# Patient Record
Sex: Male | Born: 1985 | Race: White | Hispanic: No | Smoking: Never smoker
Health system: Southern US, Community
[De-identification: ages and names within clinical notes are randomized; demographics above are authoritative.]

## PROBLEM LIST (undated history)

## (undated) DIAGNOSIS — G4733 Obstructive sleep apnea (adult) (pediatric): Secondary | ICD-10-CM

## (undated) DIAGNOSIS — I10 Essential (primary) hypertension: Secondary | ICD-10-CM

## (undated) DIAGNOSIS — F419 Anxiety disorder, unspecified: Secondary | ICD-10-CM

## (undated) DIAGNOSIS — E119 Type 2 diabetes mellitus without complications: Secondary | ICD-10-CM

## (undated) DIAGNOSIS — G43909 Migraine, unspecified, not intractable, without status migrainosus: Secondary | ICD-10-CM

## (undated) DIAGNOSIS — F4310 Post-traumatic stress disorder, unspecified: Secondary | ICD-10-CM

## (undated) HISTORY — DX: Obstructive sleep apnea (adult) (pediatric): G47.33

## (undated) HISTORY — DX: Post-traumatic stress disorder, unspecified: F43.10

## (undated) HISTORY — DX: Migraine, unspecified, not intractable, without status migrainosus: G43.909

---
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
## 2003-08-23 ENCOUNTER — Encounter: Payer: Self-pay | Admitting: Emergency Medicine

## 2003-08-23 ENCOUNTER — Emergency Department (HOSPITAL_COMMUNITY): Admission: EM | Admit: 2003-08-23 | Discharge: 2003-08-23 | Payer: Self-pay | Admitting: Emergency Medicine

## 2005-08-21 ENCOUNTER — Observation Stay (HOSPITAL_COMMUNITY): Admission: AD | Admit: 2005-08-21 | Discharge: 2005-08-22 | Payer: Self-pay | Admitting: Family Medicine

## 2006-03-18 ENCOUNTER — Emergency Department (HOSPITAL_COMMUNITY): Admission: EM | Admit: 2006-03-18 | Discharge: 2006-03-18 | Payer: Self-pay | Admitting: Emergency Medicine

## 2006-03-25 ENCOUNTER — Ambulatory Visit: Payer: Self-pay | Admitting: Orthopedic Surgery

## 2006-09-01 ENCOUNTER — Emergency Department (HOSPITAL_COMMUNITY): Admission: EM | Admit: 2006-09-01 | Discharge: 2006-09-01 | Payer: Self-pay | Admitting: Emergency Medicine

## 2009-10-28 ENCOUNTER — Emergency Department (HOSPITAL_COMMUNITY): Admission: EM | Admit: 2009-10-28 | Discharge: 2009-10-29 | Payer: Self-pay | Admitting: Emergency Medicine

## 2011-05-15 NOTE — H&P (Signed)
Steven Adkins, Steven Adkins               ACCOUNT NO.:  [id number]   MEDICAL RECORD NO.:  [id number]          PATIENT TYPE:  INP   LOCATION:  A308                          FACILITY:  [facility]   PHYSICIAN:  Scott A. Gerda Diss, MD    DATE OF BIRTH:  14-Jan-1986   DATE OF ADMISSION:  08/21/2005  DATE OF DISCHARGE:  LH                                HISTORY & PHYSICAL   OBSERVATION:   CHIEF COMPLAINT:  Severe pharyngitis.   HISTORY OF PRESENT ILLNESS:  This is a 25 year old white male who has had  about a 4-day history of severe throat pain, discomfort, fever, chills,  aches, not feeling good, headache, in addition to this has vomited multiple  times a day, urinated once early this morning (it was a dark yellow color),  feels nauseated and faint like when he stands up.   PAST MEDICAL HISTORY:  Normal health history, at one time had ADD, has  recently over the past few years had very good health with minimal health  problems and minimal need to come to the doctor.  Does not smoke or drink.  Does not use any drugs.   FAMILY HISTORY:  Benign family history.   REVIEW OF SYSTEMS:  See per above.  Positive for headache, sore throat,  slight cough, gagging and vomiting.  No diarrhea, no dysuria.   PHYSICAL EXAMINATION:  HEAD AND NECK:  TMs NL.  Throat severe erythema with  exudate noted.  Neck positive anterior nodes, worse on the left than the  right.  CHEST:  Lungs are clear.  Heart is regular.  ABDOMEN:  Abdomen is soft.  EXTREMITIES:  No edema.  SKIN:  Warm and dry.   LABORATORIES:  MET-7 overall looks good.   ASSESSMENT AND PLAN:  Severe pharyngitis with gastroenteritis - I feel the  vomiting is secondary to how bad he feels.  Because of the multiple episodes  of vomiting alone with near syncope with standing up and the severe sore  throat it is felt the patient needed to be admitted in the hospital.  His  Rapid Strep was negative.  We went ahead and did blood work.  The white  count slightly  elevated with a right shift and a positive Mono Screen.  Patient will be  treated conservatively, IV fluids overnight, pain medicine as necessary and  most likely will be able to go home in the morning on Phenergan for nausea  and Vicodin for pain, cautioned drowsiness.  He is to follow up in the  office on Thursday, August 31 and stay out of work until then with low  activity.      Scott A. Gerda Diss, MD  Electronically Signed     SAL/MEDQ  D:  08/21/2005  T:  08/21/2005  Job:  [id number]

## 2012-05-10 ENCOUNTER — Encounter: Payer: Self-pay | Admitting: Pulmonary Disease

## 2012-05-10 ENCOUNTER — Ambulatory Visit (INDEPENDENT_AMBULATORY_CARE_PROVIDER_SITE_OTHER): Payer: PRIVATE HEALTH INSURANCE | Admitting: Pulmonary Disease

## 2012-05-10 VITALS — BP 114/76 | HR 73 | Temp 97.9°F | Ht 73.0 in | Wt 295.6 lb

## 2012-05-10 DIAGNOSIS — G4710 Hypersomnia, unspecified: Secondary | ICD-10-CM

## 2012-05-10 DIAGNOSIS — G4733 Obstructive sleep apnea (adult) (pediatric): Secondary | ICD-10-CM

## 2012-05-10 DIAGNOSIS — F5112 Insufficient sleep syndrome: Secondary | ICD-10-CM

## 2012-05-10 NOTE — Progress Notes (Deleted)
  Subjective:    Patient ID: Steven Adkins, male    DOB: [birth year], 26 y.o.   MRN: [id number]  HPI    Review of Systems  Constitutional: Positive for unexpected weight change. Negative for fever and appetite change.  HENT: Positive for trouble swallowing. Negative for ear pain, congestion, sore throat, sneezing and dental problem.   Respiratory: Negative for cough and shortness of breath.   Cardiovascular: Negative for chest pain, palpitations and leg swelling.  Gastrointestinal: Negative for abdominal pain.  Musculoskeletal: Negative for joint swelling.  Skin: Negative for rash.  Neurological: Negative for headaches.  Psychiatric/Behavioral: Negative for dysphoric mood. The patient is not nervous/anxious.        Objective:   Physical Exam        Assessment & Plan:

## 2012-05-10 NOTE — Progress Notes (Signed)
Chief Complaint  Patient presents with  . sleep consult    self referral. Pt c/o toss and turning, uncontrollable body jerking at night, difficulty breathing at night, snores    History of Present Illness: Steven Adkins is a 26 y.o. male for evaluation of sleep problems.  He has noticed more trouble with his sleep over the past 3 years.  This has been associated with about 150 lbs weight gain.    His wife states that he snores and will stop breathing at night.  Before he wakes up he will get jerking movements, and choking sensation.  These seem to happen more frequently.  He works as a IT sales professional.  He works both days and nights in rotating schedule.  He gets about 5 hours sleep per day.  He can fall asleep easily when sitting quiet.  He is not using anything to help him sleep or stay awake.  His Epworth score is 14 out of 24.  The patient denies sleep walking, sleep talking, bruxism, or nightmares.  There is no history of restless legs.  The patient denies sleep hallucinations, sleep paralysis, or cataplexy.   No past medical history on file.  No past surgical history on file.  No current outpatient prescriptions on file prior to visit.    No Known Allergies  family history includes Breast cancer in his paternal grandmother and Lung cancer in his paternal grandmother.   reports that he has never smoked. He does not have any smokeless tobacco history on file. He reports that he does not drink alcohol or use illicit drugs.  Review of Systems  Constitutional: Positive for unexpected weight change. Negative for fever and appetite change.  HENT: Positive for trouble swallowing. Negative for ear pain, congestion, sore throat, sneezing and dental problem.   Respiratory: Negative for cough and shortness of breath.   Cardiovascular: Negative for chest pain, palpitations and leg swelling.  Gastrointestinal: Negative for abdominal pain.  Musculoskeletal: Negative for joint  swelling.  Skin: Negative for rash.  Neurological: Negative for headaches.  Psychiatric/Behavioral: Negative for dysphoric mood. The patient is not nervous/anxious.     Physical Exam: BP 114/76  Pulse 73  Temp(Src) 97.9 F (36.6 C) (Oral)  Ht 6\' 1"  (1.854 m)  Wt 295 lb 9.6 oz (134.083 kg)  BMI 39.00 kg/m2  SpO2 98%  Body mass index is 39.00 kg/(m^2).   General - Obese HEENT - PERRLA, EOMI, no sinus tenderness, MP 4, no LAN Cardiac - s1s2 regular, no murmur Chest -  No wheeze/rales/dullness Abdomen - obese, soft, nontender Extremities - no e/c/c Neurologic - normal strength, CN intact Skin - no rashes Psychiatric - normal mood, behavior  Assessment/Plan:  No outpatient encounter prescriptions on file as of 05/10/2012.    Kenora Spayd Pager:  [phone] 05/10/2012, 9:36 AM

## 2012-05-10 NOTE — Patient Instructions (Signed)
Will schedule Sleep study Will call to schedule follow up after sleep study reviewed

## 2012-05-10 NOTE — Assessment & Plan Note (Signed)
He has snoring, witnessed apnea, sleep disruption, and daytime sleepiness.  I am concerned he could have sleep apnea.  I have explained how sleep apnea can affect the patient's health.  Driving precautions and importance of weight loss were discussed.  Treatment options for sleep apnea were reviewed.  To further evaluate will arrange for in lab sleep study.

## 2012-05-10 NOTE — Assessment & Plan Note (Signed)
Explained the importance of maintaining a regular sleep wake schedule, and trying to consolidate his sleep to one time period.

## 2012-05-27 ENCOUNTER — Emergency Department (HOSPITAL_COMMUNITY): Payer: PRIVATE HEALTH INSURANCE

## 2012-05-27 ENCOUNTER — Encounter (HOSPITAL_COMMUNITY): Payer: Self-pay | Admitting: *Deleted

## 2012-05-27 ENCOUNTER — Emergency Department (HOSPITAL_COMMUNITY)
Admission: EM | Admit: 2012-05-27 | Discharge: 2012-05-28 | Disposition: A | Discharge status: Home / Self Care | Payer: PRIVATE HEALTH INSURANCE | Attending: Emergency Medicine | Admitting: Emergency Medicine

## 2012-05-27 DIAGNOSIS — R000 Tachycardia, unspecified: Secondary | ICD-10-CM | POA: Insufficient documentation

## 2012-05-27 DIAGNOSIS — R0602 Shortness of breath: Secondary | ICD-10-CM | POA: Insufficient documentation

## 2012-05-27 DIAGNOSIS — R110 Nausea: Secondary | ICD-10-CM | POA: Insufficient documentation

## 2012-05-27 DIAGNOSIS — R51 Headache: Secondary | ICD-10-CM | POA: Insufficient documentation

## 2012-05-27 DIAGNOSIS — R079 Chest pain, unspecified: Secondary | ICD-10-CM

## 2012-05-27 HISTORY — DX: Essential (primary) hypertension: I10

## 2012-05-27 LAB — BASIC METABOLIC PANEL
Chloride: 105 mEq/L (ref 96–112)
GFR calc Af Amer: >90 mL/min (ref >90)
GFR calc non Af Amer: >90 mL/min (ref >90)
Glucose, Bld: 106 mg/dL — ABNORMAL HIGH (ref 70–99)
Potassium: 3.5 mEq/L (ref 3.5–5.1)
Sodium: 136 mEq/L (ref 135–145)

## 2012-05-27 LAB — CBC
MCH: 28.2 pg (ref 26.0–34.0)
Platelets: 206 10*3/uL (ref 150–400)
RBC: 5.32 MIL/uL (ref 4.22–5.81)

## 2012-05-27 LAB — DIFFERENTIAL
Basophils Relative: 1 % (ref 0–1)
Eosinophils Absolute: 0.1 10*3/uL (ref 0.0–0.7)
Lymphs Abs: 2.2 10*3/uL (ref 0.7–4.0)
Neutro Abs: 9 10*3/uL — ABNORMAL HIGH (ref 1.7–7.7)
Neutrophils Relative %: 75 % (ref 43–77)

## 2012-05-27 LAB — TROPONIN I
Troponin I: <0.3 ng/mL (ref <0.30)
Troponin I: <0.3 ng/mL (ref <0.30)

## 2012-05-27 MED ORDER — SODIUM CHLORIDE 0.9 % IV BOLUS (SEPSIS)
250.0000 mL | Freq: Once | INTRAVENOUS | Status: AC
  Administered 2012-05-27: 500 mL via INTRAVENOUS

## 2012-05-27 MED ORDER — HYDROMORPHONE HCL PF 1 MG/ML IJ SOLN
1.0000 mg | Freq: Once | INTRAMUSCULAR | Status: AC
  Administered 2012-05-27: 1 mg via INTRAVENOUS
  Filled 2012-05-27: qty 1

## 2012-05-27 MED ORDER — HYDROMORPHONE HCL PF 1 MG/ML IJ SOLN
1.0000 mg | Freq: Once | INTRAMUSCULAR | Status: AC
  Administered 2012-05-28: 1 mg via INTRAVENOUS
  Filled 2012-05-27: qty 1

## 2012-05-27 MED ORDER — ONDANSETRON HCL 4 MG/2ML IJ SOLN
4.0000 mg | Freq: Once | INTRAMUSCULAR | Status: AC
  Administered 2012-05-27: 4 mg via INTRAVENOUS
  Filled 2012-05-27: qty 2

## 2012-05-27 MED ORDER — SODIUM CHLORIDE 0.9 % IV SOLN: INTRAVENOUS | Status: DC

## 2012-05-27 NOTE — ED Notes (Signed)
Chest pain, states it started within the hour, c/o nausea

## 2012-05-27 NOTE — ED Provider Notes (Signed)
History   This chart was scribed for Shelda Jakes, MD by Charolett Bumpers . The patient was seen in room [facility].    CSN: [id number]  Arrival date & time 05/27/12  1858   First MD Initiated Contact with Patient 05/27/12 1904      Chief Complaint  Patient presents with  . Chest Pain    (Consider location/radiation/quality/duration/timing/severity/associated sxs/prior treatment) HPI Steven Adkins is a 26 y.o. male who presents to the Emergency Department complaining of constant, moderate to severe chest pain with associated diaphoresis, mild SOB, nausea, and headache since 6:00pm today. Patient states that his chest pain is mid-sternal, radiates to his left arm and is rated a 7/10. Patient states that he is currently having the chest pain. Patient states that he was given 2 Nitroglycerin and an aspirin by EMS, with some improvement of symptoms. Patient denies any v/d. Patient states that he felt dizziness prior to work today, but no chest pain. Patient states that the headache started after he was given nitroglycerin. Patient denies any prior hx of similar symptoms. Patient reports a h/o HTN. BP here in ED is 188/150. Per nurse's report, the patient stated that he started a diet pill one week ago.    Past Medical History  Diagnosis Date  . Hypertension     History reviewed. No pertinent past surgical history.  Family History  Problem Relation Age of Onset  . Breast cancer Paternal Grandmother   . Lung cancer Paternal Grandmother     History  Substance Use Topics  . Smoking status: Never Smoker   . Smokeless tobacco: Not on file  . Alcohol Use: No      Review of Systems  Constitutional: Positive for diaphoresis.  HENT: Negative for congestion, sore throat and neck pain.   Respiratory: Positive for shortness of breath.   Cardiovascular: Positive for chest pain.  Gastrointestinal: Positive for nausea. Negative for vomiting and abdominal pain.    Genitourinary: Negative for dysuria.  Musculoskeletal: Negative for back pain.  Neurological: Positive for headaches.  Hematological: Does not bruise/bleed easily.  All other systems reviewed and are negative.    Allergies  Review of patient's allergies indicates no known allergies.  Home Medications   Current Outpatient Rx  Name Route Sig Dispense Refill  . UNKNOWN TO PATIENT Oral Take 1 packet by mouth daily. [employer] PRO-PERFORMANCE for MEN      BP 142/84  Pulse 76  Temp(Src) 98.4 F (36.9 C) (Oral)  Resp 20  SpO2 92%  Physical Exam  Nursing note and vitals reviewed. Constitutional: He is oriented to person, place, and time. He appears well-developed and well-nourished. No distress.  HENT:  Head: Normocephalic and atraumatic.  Eyes: EOM are normal. Pupils are equal, round, and reactive to light.  Neck: Neck supple. No tracheal deviation present.  Cardiovascular: Regular rhythm and normal heart sounds.  Tachycardia present.   No murmur heard. Pulmonary/Chest: Effort normal and breath sounds normal. No respiratory distress.  Abdominal: Soft. Bowel sounds are normal. He exhibits no distension. There is no tenderness.  Musculoskeletal: Normal range of motion. He exhibits no edema.  Neurological: He is alert and oriented to person, place, and time. No sensory deficit.  Skin: Skin is warm and dry.  Psychiatric: He has a normal mood and affect. His behavior is normal.    ED Course  Procedures (including critical care time)  DIAGNOSTIC STUDIES: Oxygen Saturation is 97% on room air, normal by my interpretation.    COORDINATION  OF CARE:  1910: Discussed planned course of treatment with the patient, who is agreeable at this time.  1930: Medication Orders: Sodium Chloride 0.9% bolus 250 mL-once; Hydromorphone (Dilaudid) injection 1 mg-once; Ondansetron (Zofran) injection 4 mg-once.  Results for orders placed during the hospital encounter of 05/27/12  CBC      Component  Value Range   WBC 12.1 (*) 4.0 - 10.5 (K/uL)   RBC 5.32  4.22 - 5.81 (MIL/uL)   Hemoglobin 15.0  13.0 - 17.0 (g/dL)   HCT 16.1  09.6 - 04.5 (%)   MCV 81.4  78.0 - 100.0 (fL)   MCH 28.2  26.0 - 34.0 (pg)   MCHC 34.6  30.0 - 36.0 (g/dL)   RDW 40.9  81.1 - 91.4 (%)   Platelets 206  150 - 400 (K/uL)  DIFFERENTIAL      Component Value Range   Neutrophils Relative 75  43 - 77 (%)   Neutro Abs 9.0 (*) 1.7 - 7.7 (K/uL)   Lymphocytes Relative 18  12 - 46 (%)   Lymphs Abs 2.2  0.7 - 4.0 (K/uL)   Monocytes Relative 6  3 - 12 (%)   Monocytes Absolute 0.7  0.1 - 1.0 (K/uL)   Eosinophils Relative 1  0 - 5 (%)   Eosinophils Absolute 0.1  0.0 - 0.7 (K/uL)   Basophils Relative 1  0 - 1 (%)   Basophils Absolute 0.1  0.0 - 0.1 (K/uL)  BASIC METABOLIC PANEL      Component Value Range   Sodium 136  135 - 145 (mEq/L)   Potassium 3.5  3.5 - 5.1 (mEq/L)   Chloride 105  96 - 112 (mEq/L)   CO2 23  19 - 32 (mEq/L)   Glucose, Bld 106 (*) 70 - 99 (mg/dL)   BUN 10  6 - 23 (mg/dL)   Creatinine, Ser 7.82  0.50 - 1.35 (mg/dL)   Calcium 9.6  8.4 - 95.6 (mg/dL)   GFR calc non Af Amer >90  >90 (mL/min)   GFR calc Af Amer >90  >90 (mL/min)  D-DIMER, QUANTITATIVE      Component Value Range   D-Dimer, Quant <0.22  0.00 - 0.48 (ug/mL-FEU)  TROPONIN I      Component Value Range   Troponin I <0.30  <0.30 (ng/mL)  TROPONIN I      Component Value Range   Troponin I <0.30  <0.30 (ng/mL)      Date: 05/27/2012  Rate: 103  Rhythm: sinus tachycardia  QRS Axis: normal  Intervals: normal  ST/T Wave abnormalities: normal  Conduction Disutrbances:none  Narrative Interpretation:   Old EKG Reviewed: none available   1. Chest pain       MDM  Patient with chest pain radiating into the left arm and substernal. Onset at about 6 in the evening. Troponin x2 is been negative six-hour troponin will be admitted night d-dimer is negative patient the with persistent pain we'll probably proceed with CT angiogram to rule  out PE. Patient originally given aspirin and nitroglycerin x2 by EMS with slight improvement in ED setting given the lauded 3 times without complete resolution of the chest pain. Doubt this is cardiac in nature pulmonary malaise and is a possibility no clinical evidence for no swelling in the legs. Patient is large and does use tobacco. Order CT angios 6 clock troponin has been ordered for midnight. If negative and the six-hour troponin is negative patient we discharged home with pain control.  I personally performed the services described in this documentation, which was scribed in my presence. The recorded information has been reviewed and considered.         Shelda Jakes, MD 05/27/12 [id number]

## 2012-05-27 NOTE — ED Notes (Signed)
Denies any relief from pain meds

## 2012-05-27 NOTE — ED Notes (Signed)
Started on a diet pill one week ago

## 2012-05-28 MED ORDER — IOHEXOL 350 MG/ML SOLN
125.0000 mL | Freq: Once | INTRAVENOUS | Status: AC | PRN
  Administered 2012-05-28: 125 mL via INTRAVENOUS

## 2012-05-31 ENCOUNTER — Ambulatory Visit (HOSPITAL_BASED_OUTPATIENT_CLINIC_OR_DEPARTMENT_OTHER): Payer: PRIVATE HEALTH INSURANCE | Attending: Pulmonary Disease | Admitting: Radiology

## 2012-05-31 VITALS — Ht 73.0 in | Wt 295.0 lb

## 2012-05-31 DIAGNOSIS — Z9989 Dependence on other enabling machines and devices: Secondary | ICD-10-CM

## 2012-05-31 DIAGNOSIS — G4733 Obstructive sleep apnea (adult) (pediatric): Secondary | ICD-10-CM | POA: Insufficient documentation

## 2012-06-08 ENCOUNTER — Ambulatory Visit (INDEPENDENT_AMBULATORY_CARE_PROVIDER_SITE_OTHER): Payer: PRIVATE HEALTH INSURANCE | Admitting: Family Medicine

## 2012-06-08 ENCOUNTER — Encounter: Payer: Self-pay | Admitting: Family Medicine

## 2012-06-08 VITALS — BP 124/88 | HR 68 | Resp 16 | Ht 73.0 in | Wt 289.0 lb

## 2012-06-08 DIAGNOSIS — R7309 Other abnormal glucose: Secondary | ICD-10-CM

## 2012-06-08 DIAGNOSIS — R5381 Other malaise: Secondary | ICD-10-CM

## 2012-06-08 DIAGNOSIS — R5383 Other fatigue: Secondary | ICD-10-CM

## 2012-06-08 DIAGNOSIS — R6882 Decreased libido: Secondary | ICD-10-CM

## 2012-06-08 DIAGNOSIS — R739 Hyperglycemia, unspecified: Secondary | ICD-10-CM

## 2012-06-08 DIAGNOSIS — E669 Obesity, unspecified: Secondary | ICD-10-CM

## 2012-06-08 DIAGNOSIS — G4733 Obstructive sleep apnea (adult) (pediatric): Secondary | ICD-10-CM

## 2012-06-08 LAB — POCT URINALYSIS DIPSTICK
Bilirubin, UA: NEGATIVE
Blood, UA: NEGATIVE
Ketones, UA: NEGATIVE
Spec Grav, UA: >=1.03
pH, UA: 5.5

## 2012-06-08 LAB — LIPID PANEL
Total CHOL/HDL Ratio: 7 Ratio
VLDL: 31 mg/dL (ref 0–40)

## 2012-06-08 LAB — TSH: TSH: 1.645 u[IU]/mL (ref 0.350–4.500)

## 2012-06-08 NOTE — Patient Instructions (Addendum)
Get the labs drawn, we will call with results Call [facility] for your sleep study  Take multivitamin once a day  F/U 1 year or as needed

## 2012-06-08 NOTE — Progress Notes (Signed)
  Subjective:    Patient ID: Steven Adkins, male    DOB: [birth year], 26 y.o.   MRN: [id number]  HPI Pt here to establish care, no previous PCP, no meds He was seen by [facility] for sleep study completed last week secondary to snoring and fatigue, he has gained 60lbs in the past year. 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  He complains of being execessived fatigued, he does work out, he was seen in ED for Chest pain after using a [employer] supplement for weight loss "Ripped", negative work-up. He has also had difficulty maintaining and keep an erection for many years now.     Review of Systems   GEN- + fatigue, fever, weight loss,weakness, recent illness HEENT- denies eye drainage, change in vision, nasal discharge, CVS- denies chest pain, palpitations RESP- denies SOB, cough, wheeze ABD- denies N/V, change in stools, abd pain GU- denies dysuria, hematuria, dribbling, incontinence MSK- denies joint pain, muscle aches, injury Neuro- denies headache, dizziness, syncope, seizure activity      Objective:   Physical Exam GEN- NAD, alert and oriented x3, obese  HEENT- PERRL, EOMI, non injected sclera, pink conjunctiva, MMM, oropharynx clear Neck- Supple, no thryomegaly CVS- RRR, no murmur RESP-CTAB ABD-NABS,soft, NT,ND EXT- No edema Pulses- Radial, DP- 2+ Psych-normal affect and mood       Assessment & Plan:

## 2012-06-09 ENCOUNTER — Encounter: Payer: Self-pay | Admitting: Family Medicine

## 2012-06-09 LAB — TESTOSTERONE, FREE, TOTAL, SHBG: Testosterone: 228.16 ng/dL — ABNORMAL LOW (ref 300–890)

## 2012-06-09 NOTE — Assessment & Plan Note (Signed)
UA negative, labs per above, tesosterone also checked and appears to be low for pt age, will refer him to urology

## 2012-06-09 NOTE — Assessment & Plan Note (Signed)
Check A1C 

## 2012-06-09 NOTE — Assessment & Plan Note (Signed)
Will check baseline labs, A1c, TSH. Encouraged exercise, no use of supplments, OSA also contributing

## 2012-06-09 NOTE — Assessment & Plan Note (Signed)
He will f/u with Pulmonary for results, I do not see them today

## 2012-06-14 ENCOUNTER — Telehealth: Payer: Self-pay | Admitting: Pulmonary Disease

## 2012-06-14 DIAGNOSIS — G4733 Obstructive sleep apnea (adult) (pediatric): Secondary | ICD-10-CM

## 2012-06-14 NOTE — Procedures (Signed)
NAME:  Steven Adkins, BIERNAT NO.:  [id number]  MEDICAL RECORD NO.:  [id number]          PATIENT TYPE:  OUT  LOCATION:  SLEEP CENTER                 FACILITY:  [facility]  PHYSICIAN:  Coralyn Helling, MD        DATE OF BIRTH:  1986/05/11  DATE OF STUDY:  05/31/2012                           NOCTURNAL POLYSOMNOGRAM  REFERRING PHYSICIAN:  Coralyn Helling, MD  REFERRING PHYSICIAN:  Coralyn Helling, MD  INDICATION:  Mr. Longman is a 26 year old male who reports symptoms of snoring, sleep disruption, witnessed apnea, and daytime sleepiness.  He is referred to sleep lab for evaluation of hypersomnia with obstructive sleep apnea.  Height is 6 feet 1 inches, weight is 295 pounds.  BMI is 39.  Neck size is 18.5 inches.  Medications none.  Epworth score is 9.  SLEEP ARCHITECTURE:  The patient followed a split night study protocol. During the diagnostic portion of study, total recording time was 211 minutes.  Total sleep time was 137 minutes.  Sleep efficiency was 65%. Sleep latency was 21 minutes.  REM latency was 144 minutes.  The patient was observed in all stages of sleep and slept predominantly in the nonsupine position.  During the titration portion of study, total recording time was 165 minutes.  Total sleep time was 64 minutes.  Sleep efficiency was 39%. Sleep latency was 22 minutes.  This portion of the study was notable for the lack of REM sleep and he slept predominantly in the nonsupine position.  RESPIRATORY DATA:  The average respiratory rate was 16.  Moderate snoring was noted by the technician.  The overall apnea/hypopnea index was 23.6.  The events were exclusively obstructive in nature.  During the titration portion of the study, the patient was started on CPAP of 5 and increased to 6 cm of water.  With CPAP at 6 cm water, his apnea/hypopnea index was 0.  CARDIAC DATA:  The average heart rate was 70 and the rhythm strip showed sinus rhythm with occasional PACs and  PVCs.  OXYGEN DATA:  The baseline oxygenation was 97%.  The oxygen saturation nadir was 86%.  The study was conducted without the use of supplemental oxygen.  MOVEMENT/PARASOMNIA:  The periodic limb movement index was 0 and the patient had no restroom trips.  IMPRESSION:  This study shows evidence for moderate obstructive sleep apnea with an overall apnea/hypopnea index of 23.6 and oxygen saturation nadir of 86%.  He had good control of his respiratory events with CPAP at 6 cm of water.  Of note is that he was not observed in REM sleep while wearing CPAP.  In addition to diet, exercise, and weight reduction, I would recommend that the patient be started on CPAP 6 cm water and monitored for his clinical response.     Coralyn Helling, MD Diplomat, American Board of Sleep Medicine    VS/MEDQ  D:  06/14/2012 12:59:59  T:  06/14/2012 13:50:38  Job:  [id number]

## 2012-06-14 NOTE — Telephone Encounter (Signed)
PSG 05/31/12 >> AHI 23.6, SpO2 low 86%, PLMI 0.  CPAP 6 cm H2O >> AHI 0, + S, no R.  Results discussed with patient over phone.  Explained that best option is CPAP set up.  He would like to discuss with his wife before making a decision about this.  He will call when he makes a decision.  Will have my nurse schedule ROV in 2 to 3 months.

## 2012-06-16 NOTE — Telephone Encounter (Signed)
I spoke with pt and he states he is going to wait before he makes an apt. He will call when he is ready

## 2013-11-10 ENCOUNTER — Encounter: Payer: Self-pay | Admitting: Family Medicine

## 2013-11-10 ENCOUNTER — Ambulatory Visit (INDEPENDENT_AMBULATORY_CARE_PROVIDER_SITE_OTHER): Payer: PRIVATE HEALTH INSURANCE | Admitting: Family Medicine

## 2013-11-10 VITALS — BP 110/70 | HR 98 | Temp 97.8°F | Resp 18 | Ht 70.5 in | Wt 278.0 lb

## 2013-11-10 DIAGNOSIS — F411 Generalized anxiety disorder: Secondary | ICD-10-CM

## 2013-11-10 DIAGNOSIS — G4733 Obstructive sleep apnea (adult) (pediatric): Secondary | ICD-10-CM

## 2013-11-10 DIAGNOSIS — F4310 Post-traumatic stress disorder, unspecified: Secondary | ICD-10-CM

## 2013-11-10 DIAGNOSIS — G4700 Insomnia, unspecified: Secondary | ICD-10-CM

## 2013-11-10 MED ORDER — ZOLPIDEM TARTRATE 10 MG PO TABS: 10.0000 mg | ORAL_TABLET | Freq: Every evening | ORAL | Status: DC | PRN

## 2013-11-10 MED ORDER — VENLAFAXINE HCL ER 37.5 MG PO CP24: 37.5000 mg | ORAL_CAPSULE | Freq: Every day | ORAL | Status: DC

## 2013-11-10 NOTE — Patient Instructions (Signed)
F/u 4 WEEKS for mood and medication Start medications as prescribed

## 2013-11-12 DIAGNOSIS — G4700 Insomnia, unspecified: Secondary | ICD-10-CM | POA: Insufficient documentation

## 2013-11-12 DIAGNOSIS — F4310 Post-traumatic stress disorder, unspecified: Secondary | ICD-10-CM | POA: Insufficient documentation

## 2013-11-12 DIAGNOSIS — F411 Generalized anxiety disorder: Secondary | ICD-10-CM | POA: Insufficient documentation

## 2013-11-12 NOTE — Assessment & Plan Note (Signed)
I think this is also playing a big part into his daytime fatigue and sleep, he states he can not afford CPAP at this time
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
## 2013-11-12 NOTE — Assessment & Plan Note (Addendum)
See above SNRI He has some family support We will readdress therapy for him as well

## 2013-11-12 NOTE — Assessment & Plan Note (Addendum)
Start Effexor daily RTC 4 weeks

## 2013-11-12 NOTE — Assessment & Plan Note (Signed)
AMbien prn

## 2013-12-11 ENCOUNTER — Encounter: Payer: Self-pay | Admitting: Family Medicine

## 2013-12-11 ENCOUNTER — Ambulatory Visit (INDEPENDENT_AMBULATORY_CARE_PROVIDER_SITE_OTHER): Payer: PRIVATE HEALTH INSURANCE | Admitting: Family Medicine

## 2013-12-11 VITALS — BP 120/80 | HR 78 | Temp 97.1°F | Resp 18 | Ht 73.0 in | Wt 273.0 lb

## 2013-12-11 DIAGNOSIS — F4310 Post-traumatic stress disorder, unspecified: Secondary | ICD-10-CM

## 2013-12-11 DIAGNOSIS — G4700 Insomnia, unspecified: Secondary | ICD-10-CM

## 2013-12-11 DIAGNOSIS — F411 Generalized anxiety disorder: Secondary | ICD-10-CM

## 2013-12-11 MED ORDER — ZOLPIDEM TARTRATE 10 MG PO TABS: 10.0000 mg | ORAL_TABLET | Freq: Every evening | ORAL | Status: DC | PRN

## 2013-12-11 MED ORDER — VENLAFAXINE HCL ER 37.5 MG PO CP24: 37.5000 mg | ORAL_CAPSULE | Freq: Every day | ORAL | Status: DC

## 2013-12-11 NOTE — Patient Instructions (Signed)
F/U 3 months  Note computer not available at time of discharge

## 2013-12-11 NOTE — Assessment & Plan Note (Signed)
Pt mood improved , he declines therapy at this time, continue current low dose of SSRI

## 2013-12-11 NOTE — Progress Notes (Signed)
   Subjective:    Patient ID: Steven Adkins, male    DOB: Jun 16, 1986, 27 y.o.   MRN: [id number]  HPI  Pt here to f/u medications. 4 weeks ago seen for GAD, insomnia. 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 He is taking effexor 37.5mg  ER feels he is doing well, has not had any panic attacks and feels calmer during the day. Ambien helps him rest but he has to take it most nights. He thinks the medications at are at good dose, he can still function and overall feels much better.    Review of Systems   GEN- denies fatigue, fever, weight loss,weakness, recent illness HEENT- denies eye drainage, change in vision, nasal discharge, CVS- denies chest pain, palpitations RESP- denies SOB, cough, wheeze Neuro- denies headache, dizziness, syncope, seizure activity      Objective:   Physical Exam  GEN-NAD, alert and oriented x 3 Psych- normal affect and mood, good eye contact, well groomed      Assessment & Plan:

## 2013-12-11 NOTE — Assessment & Plan Note (Signed)
Per above, continue SNRI

## 2013-12-11 NOTE — Assessment & Plan Note (Signed)
Continue Ambien at  Current dose

## 2014-01-23 ENCOUNTER — Telehealth: Payer: Self-pay | Admitting: *Deleted

## 2014-01-23 MED ORDER — ZOLPIDEM TARTRATE 10 MG PO TABS: 10.0000 mg | ORAL_TABLET | Freq: Every evening | ORAL | Status: DC | PRN

## 2014-01-23 NOTE — Telephone Encounter (Signed)
Okay, give 2 refills 

## 2014-01-23 NOTE — Telephone Encounter (Signed)
Med phoned in °

## 2014-01-23 NOTE — Telephone Encounter (Signed)
Message copied by Samuella CotaSIMMONS, Adalbert Alberto T on Tue Jan 23, 2014  1:04 PM ------      Message from: [facility], Maralyn SagoSARAH J      Created: Mon Jan 22, 2014 11:47 AM      Contact: [phone]       Please call him ASAP regarding his Rxs for Effexor and Ambien      His is totally out of meds.  ------

## 2014-01-23 NOTE — Telephone Encounter (Signed)
Pt was called and informed that Effexor was called in on the 15th Dec 2014 with 3 refills and ambien I will route to dr for approval and call pharmacy

## 2014-03-12 ENCOUNTER — Ambulatory Visit: Payer: PRIVATE HEALTH INSURANCE | Admitting: Family Medicine

## 2014-11-20 ENCOUNTER — Ambulatory Visit (INDEPENDENT_AMBULATORY_CARE_PROVIDER_SITE_OTHER): Payer: Self-pay | Admitting: Family Medicine

## 2014-11-20 ENCOUNTER — Encounter: Payer: Self-pay | Admitting: Family Medicine

## 2014-11-20 VITALS — BP 162/94 | HR 98 | Temp 98.2°F | Resp 14 | Ht 73.0 in | Wt 290.0 lb

## 2014-11-20 DIAGNOSIS — G4733 Obstructive sleep apnea (adult) (pediatric): Secondary | ICD-10-CM

## 2014-11-20 DIAGNOSIS — E669 Obesity, unspecified: Secondary | ICD-10-CM

## 2014-11-20 DIAGNOSIS — I10 Essential (primary) hypertension: Secondary | ICD-10-CM

## 2014-11-20 DIAGNOSIS — F4310 Post-traumatic stress disorder, unspecified: Secondary | ICD-10-CM

## 2014-11-20 DIAGNOSIS — R7309 Other abnormal glucose: Secondary | ICD-10-CM

## 2014-11-20 DIAGNOSIS — G4700 Insomnia, unspecified: Secondary | ICD-10-CM

## 2014-11-20 DIAGNOSIS — R7303 Prediabetes: Secondary | ICD-10-CM

## 2014-11-20 MED ORDER — LISINOPRIL-HYDROCHLOROTHIAZIDE 10-12.5 MG PO TABS: 1.0000 | ORAL_TABLET | Freq: Every day | ORAL | Status: DC

## 2014-11-20 NOTE — Assessment & Plan Note (Signed)
[facility], seems anxiety is still uncontrolled

## 2014-11-20 NOTE — Assessment & Plan Note (Signed)
Notes from 2009 he brought today have dx of DM, though last A1C in my office 6.1% in 2013

## 2014-11-20 NOTE — Assessment & Plan Note (Signed)
Elevated [place] here and in [facility] and on home readings Start lisinopril HCTZ Obtain recent fasting labs from 1 week ago He will monitor BP at home, hopefully he will have insurance to return if not we did discuss [facility] [place] I dont understand why the V is not providing him with resources

## 2014-11-20 NOTE — Assessment & Plan Note (Signed)
Weight gain noted.  

## 2014-11-20 NOTE — Progress Notes (Signed)
Patient ID: Steven CanterJustin L Penning, male   DOB: 05/09/1986, 28 y.o.   MRN: [id number]   Subjective:    Patient ID: Steven CanterJustin L Mattioli, male    DOB: 05/30/1986, 28 y.o.   MRN: [id number]  Patient presents for Blood Pressure; VA Forms; Migraines; Sleep Apnea; and Prediabetes  patient for follow-up. He has not been seen in almost a year. He states that [facility] He brought in multiple forms for me to fill out states that they need to be filled out by his primary doctor regarding his medical problems including sleep apnea hypertension diabetes migraines all things that have resulted from his PTSD per report. He is trying to get some type of compensation from the [employer] that he has not filed for disability through [employer] office. He actually stopped working about a month ago he did not give me any specifics. He continues to suffer with his PTSD and is being followed by psychiatrist and is currently on medications that his anxiety is still high. Regarding his sleep apnea he is still been unable to afford the CPAP, he continues to have episodes where he sells breathing in his sleep. He states he is asked for a sleep test to be repeated multiple times but this [facility]. He also states he is off her with migraines for years that he is not on any medications in the past couple years and I'm aware of for this there is also noted in his history of diabetes mellitus back to 2009 he is on medications for this as well. His blood pressures been severely elevated for the past few months he states it has been [facility] he has been checking at home but it is getting higher.    Review Of Systems: per above  GEN- + fatigue, fever, weight loss,weakness, recent illness HEENT- denies eye drainage, change in vision, nasal discharge, CVS- denies chest pain, palpitations RESP- denies SOB, cough, wheeze ABD-  denies N/V, change in stools, abd pain GU- denies dysuria, hematuria, dribbling, incontinence MSK- denies joint pain, muscle aches, injury Neuro- +headache, denies dizziness, syncope, seizure activity       Objective:    BP 162/94 mmHg  Pulse 98  Temp(Src) 98.2 F (36.8 C) (Oral)  Resp 14  Ht 6\' 1"  (1.854 m)  Wt 290 lb (131.543 kg)  BMI 38.27 kg/m2 GEN- NAD, alert and oriented x3,obese HEENT- PERRL, EOMI, non injected sclera, pink conjunctiva, MMM, oropharynx clear Neck- Supple, no LAD CVS- RRR, no murmur RESP-CTAB ABD-NABS,soft,NT,ND EXT- No edema Psych- flat, depressed affect, not overly anxious, well groomed, no SI Pulses- Radial 2+        Assessment & Plan:      Problem List Items Addressed This Visit    None      Note: This dictation was prepared with Dragon dictation along with smaller phrase technology. Any transcriptional errors that result from this process are unintentional.

## 2014-11-20 NOTE — Assessment & Plan Note (Signed)
Unable to afford CPAP Still Confirmed diagnosis of OSA Advised him to look into [facility] providing this form him

## 2014-11-20 NOTE — Assessment & Plan Note (Signed)
[facility] obtain [facility]

## 2014-11-20 NOTE — Patient Instructions (Signed)
Start lisinopril HCTZ once a day  [facility] [place]  We will call with forms Print me the records and labs  F/U 8 weeks

## 2014-11-21 ENCOUNTER — Encounter: Payer: Self-pay | Admitting: Family Medicine

## 2014-11-26 ENCOUNTER — Encounter: Payer: Self-pay | Admitting: Family Medicine

## 2014-11-30 ENCOUNTER — Telehealth: Payer: Self-pay | Admitting: *Deleted

## 2014-11-30 NOTE — Telephone Encounter (Signed)
Received forms on my desk already filled out for Disablity, contacted pt to inform him ready to pick up and placed up front in folder.

## 2014-12-12 ENCOUNTER — Encounter: Payer: Self-pay | Admitting: Family Medicine

## 2015-01-15 ENCOUNTER — Ambulatory Visit: Payer: Self-pay | Admitting: Family Medicine

## 2015-01-21 ENCOUNTER — Ambulatory Visit: Payer: Self-pay | Admitting: Family Medicine

## 2015-03-07 ENCOUNTER — Encounter (HOSPITAL_COMMUNITY): Payer: Self-pay

## 2015-03-07 ENCOUNTER — Emergency Department (HOSPITAL_COMMUNITY): Payer: 59

## 2015-03-07 ENCOUNTER — Emergency Department (HOSPITAL_COMMUNITY)
Admission: EM | Admit: 2015-03-07 | Discharge: 2015-03-08 | Disposition: A | Discharge status: Home / Self Care | Payer: 59 | Attending: Emergency Medicine | Admitting: Emergency Medicine

## 2015-03-07 DIAGNOSIS — I10 Essential (primary) hypertension: Secondary | ICD-10-CM | POA: Diagnosis not present

## 2015-03-07 DIAGNOSIS — Z79899 Other long term (current) drug therapy: Secondary | ICD-10-CM | POA: Insufficient documentation

## 2015-03-07 DIAGNOSIS — Z8669 Personal history of other diseases of the nervous system and sense organs: Secondary | ICD-10-CM | POA: Insufficient documentation

## 2015-03-07 DIAGNOSIS — F4310 Post-traumatic stress disorder, unspecified: Secondary | ICD-10-CM | POA: Insufficient documentation

## 2015-03-07 DIAGNOSIS — F419 Anxiety disorder, unspecified: Secondary | ICD-10-CM | POA: Insufficient documentation

## 2015-03-07 DIAGNOSIS — K529 Noninfective gastroenteritis and colitis, unspecified: Secondary | ICD-10-CM

## 2015-03-07 DIAGNOSIS — R112 Nausea with vomiting, unspecified: Secondary | ICD-10-CM | POA: Diagnosis present

## 2015-03-07 HISTORY — DX: Anxiety disorder, unspecified: F41.9

## 2015-03-07 LAB — URINALYSIS, ROUTINE W REFLEX MICROSCOPIC
BILIRUBIN URINE: NEGATIVE
Glucose, UA: NEGATIVE mg/dL
Hgb urine dipstick: NEGATIVE
Ketones, ur: NEGATIVE mg/dL
Leukocytes, UA: NEGATIVE
Nitrite: NEGATIVE
PROTEIN: 30 mg/dL — AB
Specific Gravity, Urine: 1.024 (ref 1.005–1.030)
Urobilinogen, UA: 0.2 mg/dL (ref 0.0–1.0)
pH: 6.5 (ref 5.0–8.0)

## 2015-03-07 LAB — COMPREHENSIVE METABOLIC PANEL
ALK PHOS: 121 U/L — AB (ref 39–117)
ALT: 46 U/L (ref 0–53)
ANION GAP: 9 (ref 5–15)
AST: 30 U/L (ref 0–37)
Albumin: 5 g/dL (ref 3.5–5.2)
BUN: 13 mg/dL (ref 6–23)
CALCIUM: 9.7 mg/dL (ref 8.4–10.5)
CO2: 22 mmol/L (ref 19–32)
CREATININE: 1.01 mg/dL (ref 0.50–1.35)
Chloride: 106 mmol/L (ref 96–112)
GFR calc Af Amer: >90 mL/min (ref >90)
Glucose, Bld: 135 mg/dL — ABNORMAL HIGH (ref 70–99)
Potassium: 3.2 mmol/L — ABNORMAL LOW (ref 3.5–5.1)
SODIUM: 137 mmol/L (ref 135–145)
TOTAL PROTEIN: 8.3 g/dL (ref 6.0–8.3)
Total Bilirubin: 1 mg/dL (ref 0.3–1.2)

## 2015-03-07 LAB — CBC WITH DIFFERENTIAL/PLATELET
Basophils Absolute: 0 10*3/uL (ref 0.0–0.1)
Basophils Relative: 0 % (ref 0–1)
EOS PCT: 1 % (ref 0–5)
Eosinophils Absolute: 0.1 10*3/uL (ref 0.0–0.7)
HCT: 47.6 % (ref 39.0–52.0)
Hemoglobin: 16.5 g/dL (ref 13.0–17.0)
Lymphocytes Relative: 7 % — ABNORMAL LOW (ref 12–46)
Lymphs Abs: 1.1 10*3/uL (ref 0.7–4.0)
MCH: 28.5 pg (ref 26.0–34.0)
MCHC: 34.7 g/dL (ref 30.0–36.0)
MCV: 82.4 fL (ref 78.0–100.0)
MONO ABS: 1.1 10*3/uL — AB (ref 0.1–1.0)
MONOS PCT: 7 % (ref 3–12)
Neutro Abs: 13.5 10*3/uL — ABNORMAL HIGH (ref 1.7–7.7)
Neutrophils Relative %: 85 % — ABNORMAL HIGH (ref 43–77)
PLATELETS: 202 10*3/uL (ref 150–400)
RBC: 5.78 MIL/uL (ref 4.22–5.81)
RDW: 13 % (ref 11.5–15.5)
WBC: 15.9 10*3/uL — AB (ref 4.0–10.5)

## 2015-03-07 LAB — LIPASE, BLOOD: LIPASE: 21 U/L (ref 11–59)

## 2015-03-07 LAB — URINE MICROSCOPIC-ADD ON

## 2015-03-07 LAB — I-STAT TROPONIN, ED: TROPONIN I, POC: 0 ng/mL (ref 0.00–0.08)

## 2015-03-07 MED ORDER — ONDANSETRON HCL 4 MG/2ML IJ SOLN
4.0000 mg | Freq: Once | INTRAMUSCULAR | Status: AC
  Administered 2015-03-07: 4 mg via INTRAVENOUS
  Filled 2015-03-07: qty 2

## 2015-03-07 MED ORDER — HYDROMORPHONE HCL 1 MG/ML IJ SOLN
1.0000 mg | Freq: Once | INTRAMUSCULAR | Status: AC
  Administered 2015-03-07: 1 mg via INTRAVENOUS
  Filled 2015-03-07: qty 1

## 2015-03-07 MED ORDER — SODIUM CHLORIDE 0.9 % IV BOLUS (SEPSIS)
1000.0000 mL | Freq: Once | INTRAVENOUS | Status: AC
  Administered 2015-03-07: 1000 mL via INTRAVENOUS

## 2015-03-07 MED ORDER — LORAZEPAM 2 MG/ML IJ SOLN
1.0000 mg | Freq: Once | INTRAMUSCULAR | Status: AC
  Administered 2015-03-07: 1 mg via INTRAVENOUS
  Filled 2015-03-07: qty 1

## 2015-03-07 MED ORDER — ACETAMINOPHEN 650 MG RE SUPP
650.0000 mg | Freq: Once | RECTAL | Status: AC
  Administered 2015-03-07: 650 mg via RECTAL
  Filled 2015-03-07: qty 1

## 2015-03-07 MED ORDER — KETOROLAC TROMETHAMINE 30 MG/ML IJ SOLN
30.0000 mg | Freq: Once | INTRAMUSCULAR | Status: AC
  Administered 2015-03-08: 30 mg via INTRAVENOUS
  Filled 2015-03-07: qty 1

## 2015-03-07 MED ORDER — SODIUM CHLORIDE 0.9 % IV BOLUS (SEPSIS): 500.0000 mL | Freq: Once | INTRAVENOUS | Status: DC

## 2015-03-07 NOTE — ED Notes (Signed)
Pt c/o of dizziness , generalized weakness x 1 week for N/V/D began. Generalized umbilical stomach pain

## 2015-03-07 NOTE — ED Notes (Signed)
Bed: ZO10WA05 Expected date:  Expected time:  Means of arrival:  Comments: EMS- HTN, n/v/d

## 2015-03-07 NOTE — ED Notes (Signed)
[facility]/o of N/V/D x 3 -4 hours. Generalized weakness for several days. Driving home from class and felt too weak to drive and decided to allow [facility] to evaluate him further. EMS administered Zofran 4mg  IVP without relief. 150NS bolus given. Pt states Diarrhea x 6 in class, nauseated and vomited x 6 with EMS.

## 2015-03-07 NOTE — ED Provider Notes (Signed)
CSN: [id number]     Arrival date & time 03/07/15  1707 History   First MD Initiated Contact with Patient 03/07/15 1802     Chief Complaint  Patient presents with  . Nausea  . Emesis  . Diarrhea  . Weakness  . Dizziness     (Consider location/radiation/quality/duration/timing/severity/associated sxs/prior Treatment) Patient is a 29 y.o. male presenting with vomiting, diarrhea, weakness, and dizziness. The history is provided by the patient. No language interpreter was used.  Emesis Severity:  Severe Duration:  4 hours Timing:  Intermittent Quality:  Bilious material Progression:  Worsening Chronicity:  New Associated symptoms: abdominal pain and diarrhea   Diarrhea:    Quality:  Watery   Severity:  Severe   Duration:  4 hours   Timing:  Intermittent   Progression:  Unchanged Risk factors: no sick contacts   Diarrhea Associated symptoms: abdominal pain and vomiting   Associated symptoms: no fever   Weakness Associated symptoms include abdominal pain, vomiting and weakness. Pertinent negatives include no fever.  Dizziness Associated symptoms: diarrhea, vomiting and weakness     Past Medical History  Diagnosis Date  . Migraines   . PTSD (post-traumatic stress disorder)   . OSA (obstructive sleep apnea)   . Anxiety   . Hypertension    History reviewed. No pertinent past surgical history. Family History  Problem Relation Age of Onset  . Breast cancer Paternal Grandmother   . Lung cancer Paternal Grandmother   . Hypertension Mother   . Hyperlipidemia Mother   . Hypertension Father    History  Substance Use Topics  . Smoking status: Never Smoker   . Smokeless tobacco: Never Used  . Alcohol Use: 4.2 oz/week    7 Cans of beer per week    Review of Systems  Constitutional: Negative for fever.  Gastrointestinal: Positive for vomiting, abdominal pain, diarrhea and abdominal distention.  Neurological: Positive for dizziness and weakness.       Vertigo  All other  systems reviewed and are negative.     Allergies  Review of patient's allergies indicates no known allergies.  Home Medications   Prior to Admission medications   Medication Sig Start Date End Date Taking? Authorizing Provider  Butalbital-APAP-Caffeine (FIORICET PO) Take 1 tablet by mouth daily as needed (pain).   Yes Historical Provider, MD  lisinopril-hydrochlorothiazide (PRINZIDE,ZESTORETIC) 10-12.5 MG per tablet Take 1 tablet by mouth daily. 11/20/14  Yes Salley Scarlet, MD  Melatonin 5 MG TABS Take 1 tablet by mouth at bedtime as needed (sleep).   Yes Historical Provider, MD  sertraline (ZOLOFT) 100 MG tablet Take 150 mg by mouth daily.   Yes Historical Provider, MD   BP 141/100 mmHg  Pulse 107  Temp(Src) 98.2 F (36.8 C) (Oral)  Resp 18  SpO2 100% Physical Exam  Constitutional: He is oriented to person, place, and time. He appears well-developed and well-nourished.  HENT:  Head: Normocephalic and atraumatic.  Mouth/Throat: Oropharynx is clear and moist.  Eyes: EOM are normal. Pupils are equal, round, and reactive to light. Right eye exhibits no nystagmus. Left eye exhibits no nystagmus.  Cardiovascular: Normal heart sounds and intact distal pulses.   Pulmonary/Chest: Effort normal and breath sounds normal.  Abdominal: Soft. He exhibits distension. There is tenderness.  Musculoskeletal: He exhibits no edema or tenderness.  Neurological: He is alert and oriented to person, place, and time.  Skin: Skin is warm.  Psychiatric: He has a normal mood and affect.  Nursing note and vitals reviewed.  ED Course  Procedures (including critical care time) Labs Review Labs Reviewed  CBC WITH DIFFERENTIAL/PLATELET - Abnormal; Notable for the following:    WBC 15.9 (*)    Neutrophils Relative % 85 (*)    Neutro Abs 13.5 (*)    Lymphocytes Relative 7 (*)    Monocytes Absolute 1.1 (*)    All other components within normal limits  COMPREHENSIVE METABOLIC PANEL  LIPASE, BLOOD   URINALYSIS, ROUTINE W REFLEX MICROSCOPIC  I-STAT TROPOININ, ED    Imaging Review No results found.   EKG Interpretation None     Patient reports dizziness with head movement x several days associated with general feeling of weakness.  Attending a class today when he suddenly developed nausea, vomiting, diarrhea.  Abdomen distended, bilateral upper quadrant tenderness and cramp-like lower abdominal pain.  Patient feels better after IV fluids and medications.  Vertiginous symptoms improved with ativan.  However, fever and tachycardia persist.  Review of previous visits indicate patient often presents with mild, persistent tachycardia.  Lab and radiology results reviewed, shared with patient.  Return precautions discussed.  [facility] MDM   Final diagnoses:  None    Gastroenteritis.    Felicie Mornavid Evangela Heffler, NP 03/08/15 [id number]  Mirian MoMatthew Gentry, MD 03/11/15 [id number]

## 2015-03-08 MED ORDER — ONDANSETRON 4 MG PO TBDP: 4.0000 mg | ORAL_TABLET | Freq: Three times a day (TID) | ORAL | Status: DC | PRN

## 2015-03-08 NOTE — Discharge Instructions (Signed)
Viral Gastroenteritis °Viral gastroenteritis is also known as stomach flu. This condition affects the stomach and intestinal tract. It can cause sudden diarrhea and vomiting. The illness typically lasts 3 to 8 days. Most people develop an immune response that eventually gets rid of the virus. While this natural response develops, the virus can make you quite ill. °CAUSES  °Many different viruses can cause gastroenteritis, such as rotavirus or noroviruses. You can catch one of these viruses by consuming contaminated food or water. You may also catch a virus by sharing utensils or other personal items with an infected person or by touching a contaminated surface. °SYMPTOMS  °The most common symptoms are diarrhea and vomiting. These problems can cause a severe loss of body fluids (dehydration) and a body salt (electrolyte) imbalance. Other symptoms may include: °· Fever. °· Headache. °· Fatigue. °· Abdominal pain. °DIAGNOSIS  °Your caregiver can usually diagnose viral gastroenteritis based on your symptoms and a physical exam. A stool sample may also be taken to test for the presence of viruses or other infections. °TREATMENT  °This illness typically goes away on its own. Treatments are aimed at rehydration. The most serious cases of viral gastroenteritis involve vomiting so severely that you are not able to keep fluids down. In these cases, fluids must be given through an intravenous line (IV). °HOME CARE INSTRUCTIONS  °· Drink enough fluids to keep your urine clear or pale yellow. Drink small amounts of fluids frequently and increase the amounts as tolerated. °· Ask your caregiver for specific rehydration instructions. °· Avoid: °· Foods high in sugar. °· Alcohol. °· Carbonated drinks. °· Tobacco. °· Juice. °· Caffeine drinks. °· Extremely hot or cold fluids. °· Fatty, greasy foods. °· Too much intake of anything at one time. °· Dairy products until 24 to 48 hours after diarrhea stops. °· You may consume probiotics.  Probiotics are active cultures of beneficial bacteria. They may lessen the amount and number of diarrheal stools in adults. Probiotics can be found in yogurt with active cultures and in supplements. °· Wash your hands well to avoid spreading the virus. °· Only take over-the-counter or prescription medicines for pain, discomfort, or fever as directed by your caregiver. Do not give aspirin to children. Antidiarrheal medicines are not recommended. °· Ask your caregiver if you should continue to take your regular prescribed and over-the-counter medicines. °· Keep all follow-up appointments as directed by your caregiver. °SEEK IMMEDIATE MEDICAL CARE IF:  °· You are unable to keep fluids down. °· You do not urinate at least once every 6 to 8 hours. °· You develop shortness of breath. °· You notice blood in your stool or vomit. This may look like coffee grounds. °· You have abdominal pain that increases or is concentrated in one small area (localized). °· You have persistent vomiting or diarrhea. °· You have a fever. °· The patient is a child younger than 3 months, and he or she has a fever. °· The patient is a child older than 3 months, and he or she has a fever and persistent symptoms. °· The patient is a child older than 3 months, and he or she has a fever and symptoms suddenly get worse. °· The patient is a baby, and he or she has no tears when crying. °MAKE SURE YOU:  °· Understand these instructions. °· Will watch your condition. °· Will get help right away if you are not doing well or get worse. °Document Released: 12/14/2005 Document Revised: 03/07/2012 Document Reviewed: 09/30/2011 °  ExitCare Patient Information 2015 [place], [place]. This information is not intended to replace advice given to you by your health care provider. Make sure you discuss any questions you have with your health care provider. Food Choices to Help Relieve Diarrhea When you have diarrhea, the foods you eat and your eating habits are very  important. Choosing the right foods and drinks can help relieve diarrhea. Also, because diarrhea can last up to 7 days, you need to replace lost fluids and electrolytes (such as sodium, potassium, and chloride) in order to help prevent dehydration.  WHAT GENERAL GUIDELINES DO I NEED TO FOLLOW?  Slowly drink 1 cup (8 oz) of fluid for each episode of diarrhea. If you are getting enough fluid, your urine will be clear or pale yellow.  Eat starchy foods. Some good choices include white rice, white toast, pasta, low-fiber cereal, baked potatoes (without the skin), saltine crackers, and bagels.  Avoid large servings of any cooked vegetables.  Limit fruit to two servings per day. A serving is  cup or 1 small piece.  Choose foods with less than 2 g of fiber per serving.  Limit fats to less than 8 tsp (38 g) per day.  Avoid fried foods.  Eat foods that have probiotics in them. Probiotics can be found in certain dairy products.  Avoid foods and beverages that may increase the speed at which food moves through the stomach and intestines (gastrointestinal tract). Things to avoid include:  High-fiber foods, such as dried fruit, raw fruits and vegetables, nuts, seeds, and whole grain foods.  Spicy foods and high-fat foods.  Foods and beverages sweetened with high-fructose corn syrup, honey, or sugar alcohols such as xylitol, sorbitol, and mannitol. WHAT FOODS ARE RECOMMENDED? Grains White rice. White, [place], or pita breads (fresh or toasted), including plain rolls, buns, or bagels. White pasta. Saltine, soda, or graham crackers. Pretzels. Low-fiber cereal. Cooked cereals made with water (such as cornmeal, farina, or cream cereals). Plain muffins. Matzo. Melba toast. Zwieback.  Vegetables Potatoes (without the skin). Strained tomato and vegetable juices. Most well-cooked and canned vegetables without seeds. Tender lettuce. Fruits Cooked or canned applesauce, apricots, cherries, fruit cocktail,  grapefruit, peaches, pears, or plums. Fresh bananas, apples without skin, cherries, grapes, cantaloupe, grapefruit, peaches, oranges, or plums.  Meat and Other Protein Products Baked or boiled chicken. Eggs. Tofu. Fish. Seafood. Smooth peanut butter. Ground or well-cooked tender beef, ham, veal, lamb, pork, or poultry.  Dairy Plain yogurt, kefir, and unsweetened liquid yogurt. Lactose-free milk, buttermilk, or soy milk. Plain hard cheese. Beverages Sport drinks. Clear broths. Diluted fruit juices (except prune). Regular, caffeine-free sodas such as ginger ale. Water. Decaffeinated teas. Oral rehydration solutions. Sugar-free beverages not sweetened with sugar alcohols. Other Bouillon, broth, or soups made from recommended foods.  The items listed above may not be a complete list of recommended foods or beverages. Contact your dietitian for more options. WHAT FOODS ARE NOT RECOMMENDED? Grains Whole grain, whole wheat, bran, or rye breads, rolls, pastas, crackers, and cereals. Wild or brown rice. Cereals that contain more than 2 g of fiber per serving. Corn tortillas or taco shells. Cooked or dry oatmeal. Granola. Popcorn. Vegetables Raw vegetables. Cabbage, broccoli, Brussels sprouts, artichokes, baked beans, beet greens, corn, kale, legumes, peas, sweet potatoes, and yams. Potato skins. Cooked spinach and cabbage. Fruits Dried fruit, including raisins and dates. Raw fruits. Stewed or dried prunes. Fresh apples with skin, apricots, mangoes, pears, raspberries, and strawberries.  Meat and Other Protein Products Chunky peanut butter.  Nuts and seeds. Beans and lentils. Tomasa BlaseBacon.  Dairy High-fat cheeses. Milk, chocolate milk, and beverages made with milk, such as milk shakes. Cream. Ice cream. Sweets and Desserts Sweet rolls, doughnuts, and sweet breads. Pancakes and waffles. Fats and Oils Butter. Cream sauces. Margarine. Salad oils. Plain salad dressings. Olives. Avocados.  Beverages Caffeinated  beverages (such as coffee, tea, soda, or energy drinks). Alcoholic beverages. Fruit juices with pulp. Prune juice. Soft drinks sweetened with high-fructose corn syrup or sugar alcohols. Other Coconut. Hot sauce. Chili powder. Mayonnaise. Gravy. Cream-based or milk-based soups.  The items listed above may not be a complete list of foods and beverages to avoid. Contact your dietitian for more information. WHAT SHOULD I DO IF I BECOME DEHYDRATED? Diarrhea can sometimes lead to dehydration. Signs of dehydration include dark urine and dry mouth and skin. If you think you are dehydrated, you should rehydrate with an oral rehydration solution. These solutions can be purchased at pharmacies, retail stores, or online.  Drink -1 cup (120-240 mL) of oral rehydration solution each time you have an episode of diarrhea. If drinking this amount makes your diarrhea worse, try drinking smaller amounts more often. For example, drink 1-3 tsp (5-15 mL) every 5-10 minutes.  A general rule for staying hydrated is to drink 1-2 L of fluid per day. Talk to your health care provider about the specific amount you should be drinking each day. Drink enough fluids to keep your urine clear or pale yellow. Document Released: 03/05/2004 Document Revised: 12/19/2013 Document Reviewed: 11/06/2013 [facility] Patient Information 2015 [place], [place]. This information is not intended to replace advice given to you by your health care provider. Make sure you discuss any questions you have with your health care provider. Clear Liquid Diet A clear liquid diet is a short-term diet that is prescribed to provide the necessary fluid and basic energy you need when you can have nothing else. The clear liquid diet consists of liquids or solids that will become liquid at room temperature. You should be able to see through the liquid. There are many reasons that you may be restricted to clear liquids, such as:  When you have a sudden-onset (acute)  condition that occurs before or after surgery.  To help your body slowly get adjusted to food again after a long period when you were unable to have food.  Replacement of fluids when you have a diarrheal disease.  When you are going to have certain exams, such as a colonoscopy, in which instruments are inserted inside your body to look at parts of your digestive system. WHAT CAN I HAVE? A clear liquid diet does not provide all the nutrients you need. It is important to choose a variety of the following items to get as many nutrients as possible:  Vegetable juices that do not have pulp.  Fruit juices and fruit drinks that do not have pulp.  Coffee (regular or decaffeinated), tea, or soda at the discretion of your health care provider.  Clear bouillon, broth, or strained broth-based soups.  High-protein and flavored gelatins.  Sugar or honey.  Ices or frozen ice pops that do not contain milk. If you are not sure whether you can have certain items, you should ask your health care provider. You may also ask your health care provider if there are any other clear liquid options. Document Released: 12/14/2005 Document Revised: 12/19/2013 Document Reviewed: 11/10/2013 [facility] Patient Information 2015 [place], [place]. This information is not intended to replace advice given to you by  your health care provider. Make sure you discuss any questions you have with your health care provider. ° °

## 2015-03-11 LAB — STOOL CULTURE: SPECIAL REQUESTS: NORMAL

## 2018-05-30 ENCOUNTER — Emergency Department (HOSPITAL_COMMUNITY)
Admission: EM | Admit: 2018-05-30 | Discharge: 2018-05-30 | Disposition: A | Discharge status: Home / Self Care | Payer: Non-veteran care | Attending: Emergency Medicine | Admitting: Emergency Medicine

## 2018-05-30 ENCOUNTER — Encounter (HOSPITAL_COMMUNITY): Payer: Self-pay | Admitting: Emergency Medicine

## 2018-05-30 DIAGNOSIS — G43809 Other migraine, not intractable, without status migrainosus: Secondary | ICD-10-CM | POA: Diagnosis not present

## 2018-05-30 DIAGNOSIS — I10 Essential (primary) hypertension: Secondary | ICD-10-CM | POA: Insufficient documentation

## 2018-05-30 DIAGNOSIS — R51 Headache: Secondary | ICD-10-CM | POA: Diagnosis present

## 2018-05-30 MED ORDER — DIPHENHYDRAMINE HCL 50 MG/ML IJ SOLN
25.0000 mg | Freq: Once | INTRAMUSCULAR | Status: AC
  Administered 2018-05-30: 25 mg via INTRAVENOUS
  Filled 2018-05-30: qty 1

## 2018-05-30 MED ORDER — KETOROLAC TROMETHAMINE 30 MG/ML IJ SOLN
30.0000 mg | Freq: Once | INTRAMUSCULAR | Status: AC
  Administered 2018-05-30: 30 mg via INTRAVENOUS
  Filled 2018-05-30: qty 1

## 2018-05-30 MED ORDER — PROCHLORPERAZINE EDISYLATE 10 MG/2ML IJ SOLN
10.0000 mg | Freq: Once | INTRAMUSCULAR | Status: AC
  Administered 2018-05-30: 10 mg via INTRAVENOUS
  Filled 2018-05-30: qty 2

## 2018-05-30 NOTE — ED Triage Notes (Addendum)
[facility]. Reports that he has migraine with pressure across fore-head, vision loss on his left eye that resolved but then got worse again in route and right eye also. He reports that he [facility] "[facility] ago. Patient received 4MG  zofran in route with EMS for Nausea.   Patient is A&O X 4, initial NIH=1. Patient continues to report nausea and migraine pain of 10 of a 0-10scale

## 2018-05-30 NOTE — ED Provider Notes (Signed)
[facility] EMERGENCY DEPARTMENT Provider Note   CSN: [id number] Arrival date & time: 05/30/18  1718     History   Chief Complaint Chief Complaint  Patient presents with  . Migraine    vision loss    HPI Steven Adkins is a 32 y.o. male.  HPI   He presents for evaluation of headache which began around 3:30 PM today, suddenly with decreased vision in his left eye.  This syndrome is typical for him, when he has a "migraine headache."  He gets care for his headaches through the [place] system.  About 2 months ago he began taking daily Imitrex, to prevent headaches.  He has had variable results with this.  In addition for headaches he occasionally takes various over-the-counter preparations.  He does not take medications for high blood pressure.  He is a Cytogeneticist, and had previous injuries from an IED device, and has subsequently developed frequent headaches.  He had comprehensive evaluation for the injuries, while in the [employer], in [place].  He denies recent fever, chills, nausea, vomiting, paresthesias, focal weakness, chest or back pain.  There are no other known modifying factors.  Past Medical History:  Diagnosis Date  . Anxiety   . Hypertension   . Migraines   . OSA (obstructive sleep apnea)   . PTSD (post-traumatic stress disorder)     Patient Active Problem List   Diagnosis Date Noted  . Essential hypertension 11/20/2014  . Glucose intolerance (pre-diabetes) 11/20/2014  . GAD (generalized anxiety disorder) 11/12/2013  . Insomnia 11/12/2013  . PTSD (post-traumatic stress disorder) 11/12/2013  . Obesity 06/08/2012  . Decreased libido 06/08/2012  . OSA (obstructive sleep apnea) 05/10/2012  . 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      Home Medications    Prior to Admission medications   Medication Sig Start Date End Date Taking? Authorizing Provider  SUMAtriptan (IMITREX) 6 MG/0.5ML SOLN injection Inject 6 mg  into the skin daily as needed for migraine or headache. May repeat in 2 hours if headache persists or recurs.   Yes [provider]  lisinopril-hydrochlorothiazide (PRINZIDE,ZESTORETIC) 10-12.5 MG per tablet Take 1 tablet by mouth daily. Patient not taking: Reported on 05/30/2018 11/20/14   Salley Scarlet, MD  ondansetron (ZOFRAN ODT) 4 MG disintegrating tablet Take 1 tablet (4 mg total) by mouth every 8 (eight) hours as needed for vomiting. Patient not taking: Reported on 05/30/2018 03/08/15   Felicie Morn, NP    Family History Family History  Problem Relation Age of Onset  . Hypertension Mother   . Hyperlipidemia Mother   . Hypertension Father   . Breast cancer Paternal Grandmother   . Lung cancer Paternal Grandmother     Social History Social History   Tobacco Use  . Smoking status: Never Smoker  . Smokeless tobacco: Never Used  Substance Use Topics  . Alcohol use: Yes    Alcohol/week: 4.2 oz    Types: 7 Cans of beer per week  . Drug use: No     Allergies   Patient has no known allergies.   Review of Systems Review of Systems  All other systems reviewed and are negative.    Physical Exam Updated Vital Signs BP (!) 152/93   Pulse 80   Temp 98.9 F (37.2 C)   Resp 16   Ht 6' (1.829 m)   Wt 129.3 kg (285 lb)   SpO2 96%   BMI 38.65 kg/m  Physical Exam  Constitutional: He is oriented to person, place, and time. He appears well-developed and well-nourished. No distress.  HENT:  Head: Normocephalic and atraumatic.  Right Ear: External ear normal.  Left Ear: External ear normal.  Mouth/Throat: Oropharynx is clear and moist.  Eyes: Pupils are equal, round, and reactive to light. Conjunctivae and EOM are normal. Right eye exhibits no discharge. Left eye exhibits no discharge.  Neck: Normal range of motion and phonation normal. Neck supple.  Cardiovascular: Normal rate, regular rhythm and normal heart sounds.  Pulmonary/Chest: Effort normal and breath  sounds normal. He exhibits no bony tenderness.  Abdominal: Soft. There is no tenderness.  Musculoskeletal: Normal range of motion.  Neurological: He is alert and oriented to person, place, and time. No cranial nerve deficit or sensory deficit. He exhibits normal muscle tone. Coordination normal.  No dysarthria, or aphasia.  Normal strength and sensation arms and legs bilaterally.  He is able to finger count with the left eye, accurately.  Skin: Skin is warm, dry and intact.  Psychiatric: He has a normal mood and affect. His behavior is normal. Judgment and thought content normal.  Nursing note and vitals reviewed.    ED Treatments / Results  Labs (all labs ordered are listed, but only abnormal results are displayed) Labs Reviewed - No data to display  EKG EKG Interpretation  Date/Time:  Monday May 30 2018 17:31:04 EDT Ventricular Rate:  88 PR Interval:    QRS Duration: 81 QT Interval:  352 QTC Calculation: 426 R Axis:   49 Text Interpretation:  Sinus rhythm Borderline short PR interval Nonspecific repol abnormality, inferior leads Since last tracing inferior T wave abnormality is new Confirmed by Mancel BaleWentz, Malcolm Quast [phone]) on 05/30/2018 6:22:45 PM   Radiology No results found.  Procedures Procedures (including critical care time)  Medications Ordered in ED Medications  prochlorperazine (COMPAZINE) injection 10 mg (10 mg Intravenous Given 05/30/18 1851)  diphenhydrAMINE (BENADRYL) injection 25 mg (25 mg Intravenous Given 05/30/18 1852)  ketorolac (TORADOL) 30 MG/ML injection 30 mg (30 mg Intravenous Given 05/30/18 1851)     Initial Impression / Assessment and Plan / ED Course  I have reviewed the triage vital signs and the nursing notes.  Pertinent labs & imaging results that were available during my care of the patient were reviewed by me and considered in my medical decision making (see chart for details).      Patient Vitals for the past 24 hrs:  BP Temp Pulse Resp SpO2  Height Weight  05/30/18 1900 (!) 152/93 - 80 16 96 % - -  05/30/18 1845 (!) 148/99 - 82 12 96 % - -  05/30/18 1830 (!) 144/107 - 76 15 97 % - -  05/30/18 1815 (!) 150/105 - 79 11 97 % - -  05/30/18 1800 (!) 178/116 - 77 17 97 % - -  05/30/18 1745 (!) 164/98 - 79 11 96 % - -  05/30/18 1736 - - - - - 6' (1.829 m) 129.3 kg (285 lb)  05/30/18 1735 (!) 155/97 98.9 F (37.2 C) 77 15 97 % - -  05/30/18 1734 - - 79 10 96 % - -  05/30/18 1733 - - 84 12 96 % - -  05/30/18 1732 - - 84 15 97 % - -  05/30/18 1731 (!) 168/114 - 78 16 96 % - -  05/30/18 1727 - - - - 100 % - -    8:25 PM Reevaluation with update and discussion. After  initial assessment and treatment, an updated evaluation reveals he is sitting up, calm and comfortable.  He states his headache is resolved and his vision is returned to normal.  Findings discussed and questions answered. Mancel Bale   Medical Decision Making: Recurrent migraine, with reassuring recovery after treatment with migraine cocktail.  Intracranial bleeding, CVA, angitis or radiculopathy.  CRITICAL CARE-no Performed by: Mancel Bale   Nursing Notes Reviewed/ Care Coordinated Applicable Imaging Reviewed Interpretation of Laboratory Data incorporated into ED treatment  The patient appears reasonably screened and/or stabilized for discharge and I doubt any other medical condition or other [facility] requiring further screening, evaluation, or treatment in the ED at this time prior to discharge.  Plan: Home Medications-usual medications; Home Treatments-rest, fluids; return here if the recommended treatment, does not improve the symptoms; Recommended follow up-PCP, as needed     Final Clinical Impressions(s) / ED Diagnoses   Final diagnoses:  Other migraine without status migrainosus, not intractable    ED Discharge Orders    None       Mancel Bale, MD 05/30/18 2026

## 2019-11-24 ENCOUNTER — Emergency Department (HOSPITAL_COMMUNITY): Payer: No Typology Code available for payment source

## 2019-11-24 ENCOUNTER — Other Ambulatory Visit: Payer: Self-pay

## 2019-11-24 ENCOUNTER — Encounter (HOSPITAL_COMMUNITY): Payer: Self-pay | Admitting: *Deleted

## 2019-11-24 ENCOUNTER — Emergency Department (HOSPITAL_COMMUNITY)
Admission: EM | Admit: 2019-11-24 | Discharge: 2019-11-24 | Disposition: A | Discharge status: Home / Self Care | Payer: No Typology Code available for payment source | Attending: Emergency Medicine | Admitting: Emergency Medicine

## 2019-11-24 DIAGNOSIS — I10 Essential (primary) hypertension: Secondary | ICD-10-CM | POA: Insufficient documentation

## 2019-11-24 DIAGNOSIS — U071 COVID-19: Secondary | ICD-10-CM | POA: Insufficient documentation

## 2019-11-24 DIAGNOSIS — E119 Type 2 diabetes mellitus without complications: Secondary | ICD-10-CM | POA: Diagnosis not present

## 2019-11-24 HISTORY — DX: Type 2 diabetes mellitus without complications: E11.9

## 2019-11-24 MED ORDER — NAPROXEN 500 MG PO TABS: 500.0000 mg | ORAL_TABLET | Freq: Two times a day (BID) | ORAL | 0 refills | Status: DC

## 2019-11-24 MED ORDER — BENZONATATE 100 MG PO CAPS: 100.0000 mg | ORAL_CAPSULE | Freq: Three times a day (TID) | ORAL | 0 refills | Status: DC

## 2019-11-24 MED ORDER — ACETAMINOPHEN 500 MG PO TABS
1000.0000 mg | ORAL_TABLET | Freq: Once | ORAL | Status: AC
  Administered 2019-11-24: 1000 mg via ORAL
  Filled 2019-11-24: qty 2

## 2019-11-24 MED ORDER — ALBUTEROL SULFATE HFA 108 (90 BASE) MCG/ACT IN AERS
4.0000 | INHALATION_SPRAY | Freq: Once | RESPIRATORY_TRACT | Status: AC
  Administered 2019-11-24: 15:00:00 4 via RESPIRATORY_TRACT
  Filled 2019-11-24: qty 6.7

## 2019-11-24 MED ORDER — AEROCHAMBER PLUS FLO-VU LARGE MISC
1.0000 | Freq: Once | Status: AC
  Administered 2019-11-24: 15:00:00
  Filled 2019-11-24: qty 1

## 2019-11-24 MED ORDER — FLUTICASONE PROPIONATE 50 MCG/ACT NA SUSP: 1.0000 | Freq: Every day | NASAL | 0 refills | Status: DC | PRN

## 2019-11-24 NOTE — Discharge Instructions (Addendum)
You were seen in the emergency department today for coronavirus.  Your chest x-ray was normal.  Your EKG was reassuring.  Your oxygen levels were reassuring.  We are sending you home with the following medicines to help with your symptoms: -Flonase: This is a nasal steroid use 1 spray/day as needed for congestion -Tessalon: This is an antibiotic cough medicine to use every 8 hours as needed -Naproxen: this is a nonsteroidal anti-inflammatory medication that will help with pain and swelling. Be sure to take this medication as prescribed with food, 1 pill every 12 hours,  It should be taken with food, as it can cause stomach upset, and more seriously, stomach bleeding. Do not take other nonsteroidal anti-inflammatory medications with this such as Advil, Motrin, Aleve, Mobic, Goodie Powder, or Motrin.   -Albuterol inhaler: Use 1 to 2 puffs every 4-6 hours as needed for trouble breathing/wheezing.  You make take Tylenol per over the counter dosing with these medications.   We have prescribed you new medication(s) today. Discuss the medications prescribed today with your pharmacist as they can have adverse effects and interactions with your other medicines including over the counter and prescribed medications. Seek medical evaluation if you start to experience new or abnormal symptoms after taking one of these medicines, seek care immediately if you start to experience difficulty breathing, feeling of your throat closing, facial swelling, or rash as these could be indications of a more serious allergic reaction   We are instructing patients with coronavirus to quarantine themselves for 14 days. You may be able to discontinue self quarantine if the following conditions are met:   Persons with COVID-19 who have symptoms and were directed to care for themselves at home may discontinue home isolation under the  following conditions: - It has been at least 7 days have passed since symptoms first appeared. - AND  at least 3 days (72 hours) have passed since recovery defined as resolution of fever without the use of fever-reducing medications and improvement in respiratory symptoms (e.g., cough, shortness of breath)  Please follow the below quarantine instructions.   Please follow up with primary care within 1 week for re-evaluation- call prior to going to the office to make them aware of your symptoms. Return to the ER for new or worsening symptoms including but not limited to increased work of breathing, worsened/different chest pain, passing out, or any other concerns.       Person Under Monitoring Name: Steven Adkins  Location: [address] [place] [address]   Infection Prevention Recommendations for Individuals Confirmed to have, or Being Evaluated for, 2019 Novel Coronavirus (COVID-19) Infection Who Receive Care at Home  Individuals who are confirmed to have, or are being evaluated for, COVID-19 should follow the prevention steps below until a healthcare provider or local or state health department says they can return to normal activities.  Stay home except to get medical care You should restrict activities outside your home, except for getting medical care. Do not go to work, school, or public areas, and do not use public transportation or taxis.  Call ahead before visiting your doctor Before your medical appointment, call the healthcare provider and tell them that you have, or are being evaluated for, COVID-19 infection. This will help the healthcare providers office take steps to keep other people from getting infected. Ask your healthcare provider to call the local or state health department.  Monitor your symptoms Seek prompt medical attention if your illness is worsening (e.g.,  difficulty breathing). Before going to your medical appointment, call the healthcare provider and tell them that you have, or are being evaluated for, COVID-19 infection. Ask your healthcare  provider to call the local or state health department.  Wear a facemask You should wear a facemask that covers your nose and mouth when you are in the same room with other people and when you visit a healthcare provider. People who live with or visit you should also wear a facemask while they are in the same room with you.  Separate yourself from other people in your home As much as possible, you should stay in a different room from other people in your home. Also, you should use a separate bathroom, if available.  Avoid sharing household items You should not share dishes, drinking glasses, cups, eating utensils, towels, bedding, or other items with other people in your home. After using these items, you should wash them thoroughly with soap and water.  Cover your coughs and sneezes Cover your mouth and nose with a tissue when you cough or sneeze, or you can cough or sneeze into your sleeve. Throw used tissues in a lined trash can, and immediately wash your hands with soap and water for at least 20 seconds or use an alcohol-based hand rub.  Wash your [employer] [employer] your hands often and thoroughly with soap and water for at least 20 seconds. You can use an alcohol-based hand sanitizer if soap and water are not available and if your hands are not visibly dirty. Avoid touching your eyes, nose, and mouth with unwashed hands.   Prevention Steps for Caregivers and Household Members of Individuals Confirmed to have, or Being Evaluated for, COVID-19 Infection Being Cared for in the Home  If you live with, or provide care at home for, a person confirmed to have, or being evaluated for, COVID-19 infection please follow these guidelines to prevent infection:  Follow healthcare providers instructions Make sure that you understand and can help the patient follow any healthcare provider instructions for all care.  Provide for the patients basic needs You should help the patient with basic needs  in the home and provide support for getting groceries, prescriptions, and other personal needs.  Monitor the patients symptoms If they are getting sicker, call his or her medical provider and tell them that the patient has, or is being evaluated for, COVID-19 infection. This will help the healthcare providers office take steps to keep other people from getting infected. Ask the healthcare provider to call the local or state health department.  Limit the number of people who have contact with the patient If possible, have only one caregiver for the patient. Other household members should stay in another home or place of residence. If this is not possible, they should stay in another room, or be separated from the patient as much as possible. Use a separate bathroom, if available. Restrict visitors who do not have an essential need to be in the home.  Keep older adults, very young children, and other sick people away from the patient Keep older adults, very young children, and those who have compromised immune systems or chronic health conditions away from the patient. This includes people with chronic heart, lung, or kidney conditions, diabetes, and cancer.  Ensure good ventilation Make sure that shared spaces in the home have good air flow, such as from an air conditioner or an opened window, weather permitting.  Wash your hands often Wash your hands often and thoroughly  with soap and water for at least 20 seconds. You can use an alcohol based hand sanitizer if soap and water are not available and if your hands are not visibly dirty. Avoid touching your eyes, nose, and mouth with unwashed hands. Use disposable paper towels to dry your hands. If not available, use dedicated cloth towels and replace them when they become wet.  Wear a facemask and gloves Wear a disposable facemask at all times in the room and gloves when you touch or have contact with the patients blood, body fluids,  and/or secretions or excretions, such as sweat, saliva, sputum, nasal mucus, vomit, urine, or feces.  Ensure the mask fits over your nose and mouth tightly, and do not touch it during use. Throw out disposable facemasks and gloves after using them. Do not reuse. Wash your hands immediately after removing your facemask and gloves. If your personal clothing becomes contaminated, carefully remove clothing and launder. Wash your hands after handling contaminated clothing. Place all used disposable facemasks, gloves, and other waste in a lined container before disposing them with other household waste. Remove gloves and wash your hands immediately after handling these items.  Do not share dishes, glasses, or other household items with the patient Avoid sharing household items. You should not share dishes, drinking glasses, cups, eating utensils, towels, bedding, or other items with a patient who is confirmed to have, or being evaluated for, COVID-19 infection. After the person uses these items, you should wash them thoroughly with soap and water.  Wash laundry thoroughly Immediately remove and wash clothes or bedding that have blood, body fluids, and/or secretions or excretions, such as sweat, saliva, sputum, nasal mucus, vomit, urine, or feces, on them. Wear gloves when handling laundry from the patient. Read and follow directions on labels of laundry or clothing items and detergent. In general, wash and dry with the warmest temperatures recommended on the label.  Clean all areas the individual has used often Clean all touchable surfaces, such as counters, tabletops, doorknobs, bathroom fixtures, toilets, phones, keyboards, tablets, and bedside tables, every day. Also, clean any surfaces that may have blood, body fluids, and/or secretions or excretions on them. Wear gloves when cleaning surfaces the patient has come in contact with. Use a diluted bleach solution (e.g., dilute bleach with 1 part bleach  and 10 parts water) or a household disinfectant with a label that says EPA-registered for coronaviruses. To make a bleach solution at home, add 1 tablespoon of bleach to 1 quart (4 cups) of water. For a larger supply, add  cup of bleach to 1 gallon (16 cups) of water. Read labels of cleaning products and follow recommendations provided on product labels. Labels contain instructions for safe and effective use of the cleaning product including precautions you should take when applying the product, such as wearing gloves or eye protection and making sure you have good ventilation during use of the product. Remove gloves and wash hands immediately after cleaning.  Monitor yourself for signs and symptoms of illness Caregivers and household members are considered close contacts, should monitor their health, and will be asked to limit movement outside of the home to the extent possible. Follow the monitoring steps for close contacts listed on the symptom monitoring form.   ? If you have additional questions, contact your local health department or call the epidemiologist on call at [phone] (available 24/7). ? This guidance is subject to change. For the most up-to-date guidance from [facility], please refer to their website: YouBlogs.pl

## 2019-11-24 NOTE — ED Triage Notes (Signed)
Arrives via EMS with complaint of positive covid today. States for the last few days he has had chills, body aches and "it hurts to breathe". Temp at home 98.0

## 2019-11-24 NOTE — ED Provider Notes (Signed)
[facility]ERGENCY DEPARTMENT Provider Note   CSN: [id number] Arrival date & time: 11/24/19  1346     History   Chief Complaint Chief Complaint  Patient presents with  . Shortness of Breath    HPI Steven Adkins is a 33 y.o. male with a history of anxiety, hypertension, diabetes mellitus, OSA, and PTSD who presents to the emergency department with complaints of dyspnea x 3 days. Patient states he has been feeling poorly with nasal congestion, productive cough, chest discomfort with coughing/deep breaths, dyspnea, a few loose stools, fatigue, & chills. No alleviating/aggravating factors other than coughing/deep breaths irritating his chest. Had covid testing today in [place] that was positive. He wanted to come get checked out to make sure he is okay. Denies fever, ear pain, sore throat, abdominal pain, syncope, unilateral leg pain/swelling, hemoptysis, recent surgery/trauma, recent long travel, hormone use, personal hx of cancer, or hx of DVT/PE. States his appetite is maintaining.    HPI  Past Medical History:  Diagnosis Date  . Anxiety   . Diabetes mellitus without complication (HCC)   . Hypertension   . Migraines   . OSA (obstructive sleep apnea)   . PTSD (post-traumatic stress disorder)     Patient Active Problem List   Diagnosis Date Noted  . Essential hypertension 11/20/2014  . Glucose intolerance (pre-diabetes) 11/20/2014  . GAD (generalized anxiety disorder) 11/12/2013  . Insomnia 11/12/2013  . PTSD (post-traumatic stress disorder) 11/12/2013  . Obesity 06/08/2012  . Decreased libido 06/08/2012  . OSA (obstructive sleep apnea) 05/10/2012  . 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 No pertinent surgical history.      Home Medications    Prior to Admission medications   Medication Sig Start Date End Date Taking? Authorizing Provider  lisinopril-hydrochlorothiazide (PRINZIDE,ZESTORETIC) 10-12.5 MG per tablet Take 1 tablet by mouth daily.  Patient not taking: Reported on 05/30/2018 11/20/14   Salley Scarleturham, [place] F, MD  ondansetron (ZOFRAN ODT) 4 MG disintegrating tablet Take 1 tablet (4 mg total) by mouth every 8 (eight) hours as needed for vomiting. Patient not taking: Reported on 05/30/2018 03/08/15   Felicie MornSmith, David, NP  SUMAtriptan [facility](IMITREX) 6 MG/0.5ML SOLN injection Inject 6 mg into the skin daily as needed for migraine or headache. May repeat in 2 hours if headache persists or recurs.    [provider]    Family History Family History  Problem Relation Age of Onset  . Hypertension Mother   . Hyperlipidemia Mother   . Hypertension Father   . Breast cancer Paternal Grandmother   . Lung cancer Paternal Grandmother     Social History Social History   Tobacco Use  . Smoking status: Never Smoker  . Smokeless tobacco: Never Used  Substance Use Topics  . Alcohol use: Yes    Alcohol/week: 7.0 standard drinks    Types: 7 Cans of beer per week    Comment: once a month  . Drug use: No     Allergies   Patient has no known allergies.   Review of Systems Review of Systems  Constitutional: Positive for chills and fatigue. Negative for fever.  HENT: Positive for congestion. Negative for ear pain and sore throat.   Respiratory: Positive for cough and shortness of breath.   Cardiovascular: Positive for chest pain (w coughing/deep breaths). Negative for leg swelling.  Gastrointestinal: Positive for diarrhea. Negative for abdominal pain, nausea and vomiting.  Neurological: Negative for syncope.  All other systems reviewed and are negative.  Physical Exam Updated Vital Signs BP (!) 153/93 (BP Location: Left Arm)   Pulse 96   Temp 99.2 F (37.3 C) (Oral)   Resp 18   Ht 6' (1.829 m)   Wt 124.7 kg   SpO2 98%   BMI 37.30 kg/m   Physical Exam Vitals signs and nursing note reviewed.  Constitutional:      General: He is not in acute distress.    Appearance: He is well-developed. He is not toxic-appearing.   HENT:     Head: Normocephalic and atraumatic.     Right Ear: Ear canal normal. Tympanic membrane is not perforated, erythematous, retracted or bulging.     Left Ear: Ear canal normal. Tympanic membrane is not perforated, erythematous, retracted or bulging.     Ears:     Comments: No mastoid erythema/swelling/tenderness.     Nose: Congestion present.     Right Sinus: No maxillary sinus tenderness or frontal sinus tenderness.     Left Sinus: No maxillary sinus tenderness or frontal sinus tenderness.     Mouth/Throat:     Mouth: Mucous membranes are moist.     Pharynx: Oropharynx is clear. Uvula midline. No oropharyngeal exudate or posterior oropharyngeal erythema.     Comments: Posterior oropharynx is symmetric appearing. Patient tolerating own secretions without difficulty. No trismus. No drooling. No hot potato voice. No swelling beneath the tongue, submandibular compartment is soft.  Eyes:     General:        Right eye: No discharge.        Left eye: No discharge.     Conjunctiva/sclera: Conjunctivae normal.  Neck:     Musculoskeletal: Neck supple. No neck rigidity.  Cardiovascular:     Rate and Rhythm: Normal rate and regular rhythm.  Pulmonary:     Effort: Pulmonary effort is normal. No respiratory distress.     Breath sounds: Normal breath sounds. No wheezing, rhonchi or rales.  Chest:     Chest wall: Tenderness (mild anterior chest wall) present.  Abdominal:     General: There is no distension.     Palpations: Abdomen is soft.     Tenderness: There is no abdominal tenderness.  Musculoskeletal:     Right lower leg: He exhibits no tenderness. No edema.     Left lower leg: He exhibits no tenderness. No edema.  Lymphadenopathy:     Cervical: No cervical adenopathy.  Skin:    General: Skin is warm and dry.     Findings: No rash.  Neurological:     Mental Status: He is alert.     Comments: Clear speech.   Psychiatric:        Behavior: Behavior normal.    ED Treatments /  Results  Labs (all labs ordered are listed, but only abnormal results are displayed) Labs Reviewed - No data to display  EKG EKG Interpretation  Date/Time:  Friday November 24 2019 14:13:01 EST Ventricular Rate:  93 PR Interval:    QRS Duration: 78 QT Interval:  304 QTC Calculation: 378 R Axis:   63 Text Interpretation: Sinus rhythm Since last tracing inferior Twave abnormality is less pronounced Confirmed by Mancel Bale [phone]) on 11/24/2019 3:07:01 PM   Radiology Dg Chest Portable 1 View  Result Date: 11/24/2019 CLINICAL DATA:  Cough. EXAM: PORTABLE CHEST 1 VIEW COMPARISON:  None. FINDINGS: The heart size and mediastinal contours are within normal limits. Both lungs are clear. The visualized skeletal structures are unremarkable. IMPRESSION: No active disease. Electronically Signed  By: Marijo Conception M.D.   On: 11/24/2019 15:15    Procedures Procedures (including critical care time)  Medications Ordered in ED Medications  albuterol (VENTOLIN HFA) 108 (90 Base) MCG/ACT inhaler 4 puff (4 puffs Inhalation Given 11/24/19 1520)  AeroChamber Plus Flo-Vu Large MISC 1 each ( Other Given 11/24/19 1520)  acetaminophen (TYLENOL) tablet 1,000 mg (1,000 mg Oral Given 11/24/19 1519)     Initial Impression / Assessment and Plan / ED Course  I have reviewed the triage vital signs and the nursing notes.  Pertinent labs & imaging results that were available during my care of the patient were reviewed by me and considered in my medical decision making (see chart for details).   Patient with known positive COVID-19 testing this morning presents to the emergency department with a few days of respiratory sxs, chest discomfort with coughing/deep breaths, fatigue, chills, & loose stools.  Patient is nontoxic-appearing, no apparent distress, vitals WNL with exception of elevated BP, doubt HTN emergency.  Patient has a relatively benign exam.  Afebrile, no sinus tenderness, symptoms less than  10 days, doubt acute bacterial sinusitis.  No signs of AOM/AOE/mastoiditis.  Oropharynx is clear.  No meningismus.  Lungs are clear to auscultation without wheezing.  Chest x-ray without infiltrate, pneumothorax, or signs of fluid overload.  EKG with less pronounced T wave abnormality from prior tracing, no significant acute ischemic changes.  PERC negative, doubt PE.  Overall suspect chest discomfort is related to coughing/virus.  Patient is not in respiratory distress, ambulatory SpO2 99% on RA, does not appear to require admission for covid currently .  He is feeling somewhat improved in the ER.  Will discharge home with supportive care and quarantine instructions. I discussed results, treatment plan, need for follow-up, and return precautions with the patient. Provided opportunity for questions, patient confirmed understanding and is in agreement with plan.   Vitals:   11/24/19 1520 11/24/19 1521  BP:    Pulse: 90 88  Resp: 15 15  Temp:    SpO2: 99% 100%    Final Clinical Impressions(s) / ED Diagnoses   Final diagnoses:  COVID-19    ED Discharge Orders         Ordered    fluticasone (FLONASE) 50 MCG/ACT nasal spray  Daily PRN     11/24/19 1544    benzonatate (TESSALON) 100 MG capsule  Every 8 hours     11/24/19 1544    naproxen (NAPROSYN) 500 MG tablet  2 times daily     11/24/19 1544           Xzavian Semmel, [place] R, PA-C 11/24/19 1547    Daleen Bo, MD 11/25/19 [phone]

## 2020-04-26 ENCOUNTER — Ambulatory Visit
Admission: EM | Admit: 2020-04-26 | Discharge: 2020-04-26 | Disposition: A | Discharge status: Home / Self Care | Payer: Commercial Managed Care - PPO

## 2020-04-26 DIAGNOSIS — I10 Essential (primary) hypertension: Secondary | ICD-10-CM

## 2020-04-26 DIAGNOSIS — Z760 Encounter for issue of repeat prescription: Secondary | ICD-10-CM

## 2020-04-26 MED ORDER — LOSARTAN POTASSIUM-HCTZ 100-12.5 MG PO TABS: 1.0000 | ORAL_TABLET | Freq: Every day | ORAL | 0 refills | Status: AC

## 2020-04-26 NOTE — Discharge Instructions (Signed)
Blood pressure medication refilled Please continue to monitor blood pressure at home and keep a log Eat a well balanced diet of fruits, vegetables and lean meats.  Avoid foods high in fat and salt Drink water.  At least half your body weight in ounces Exercise for at least 30 minutes daily [facility] Return or go to the ED if you have any new or worsening symptoms such as vision changes, fatigue, dizziness, chest pain, shortness of breath, nausea, swelling in your hands or feet, urinary symptoms, etc..Marland Kitchen

## 2020-04-26 NOTE — ED Triage Notes (Signed)
Pt presents to have losartan refilled. [facility] until [facility] end of the month. Reports having 1 pill left. Denies any symptoms at this time.

## 2020-04-26 NOTE — ED Provider Notes (Signed)
[place]   [id number] 04/26/20 Arrival Time: 4259  CC: Medication refill  SUBJECTIVE:  KEIDEN DESKIN is a 34 y.o. male who presents for blood pressure medication refill.  Hx of HTN x couple of years.  States diastolic blood pressure on average is 90-100 when not on blood pressure medication.  Takes losartan.  States blood pressure is well-controlled on current medication.  [facility].  Denies HA, vision changes, dizziness, lightheadedness, chest pain, shortness of breath, numbness or tingling in extremities, abdominal pain, changes in bowel or bladder habits.    ROS: As per HPI.  All other pertinent ROS negative.     Past Medical History:  Diagnosis Date  . Anxiety   . Diabetes mellitus without complication ([place])   . Hypertension   . Migraines   . OSA (obstructive sleep apnea)   . PTSD (post-traumatic stress disorder)    History reviewed. No pertinent surgical history. No Known Allergies No current facility-administered medications on file prior to encounter.   Current Outpatient Medications on File Prior to Encounter  Medication Sig Dispense Refill  . losartan (COZAAR) 100 MG tablet Take 100 mg by mouth daily.    . metFORMIN (GLUCOPHAGE-XR) 500 MG 24 hr tablet Take 2 tablets by mouth daily.    Marland Kitchen omeprazole (PRILOSEC) 40 MG capsule Take 40 mg by mouth at bedtime.     . SUMAtriptan (IMITREX) 6 MG/0.5ML SOLN injection Inject 6 mg into the skin daily as needed for migraine or headache. May repeat in 2 hours if headache persists or recurs.    . [DISCONTINUED] fluticasone (FLONASE) 50 MCG/ACT nasal spray Place 1 spray into both nostrils daily as needed for rhinitis. 16 g 0   Social History   Socioeconomic History  . Marital status: Divorced    Spouse name: Not on file  . Number of children: Not on file  . Years of education: Not on file  . Highest education level: Not on file  Occupational History  . Occupation: Freight forwarder   Tobacco Use  . Smoking status: Never Smoker  . Smokeless tobacco: Never Used  Substance and Sexual Activity  . Alcohol use: Yes    Alcohol/week: 7.0 standard drinks    Types: 7 Cans of beer per week    Comment: once a month  . Drug use: No  . Sexual activity: Not Currently  Other Topics Concern  . Not on file  Social History Narrative  . Not on file   Social Determinants of Health   Financial Resource Strain:   . Difficulty of Paying Living Expenses:   Food Insecurity:   . Worried About Charity fundraiser in the Last Year:   . Arboriculturist in the Last Year:   Transportation Needs:   . Film/video editor (Medical):   Marland Kitchen Lack of Transportation (Non-Medical):   Physical Activity:   . Days of Exercise per Week:   . Minutes of Exercise per Session:   Stress:   . Feeling of Stress :   Social Connections:   . Frequency of Communication with Friends and Family:   . Frequency of Social Gatherings with Friends and Family:   . Attends Religious Services:   . Active Member of Clubs or Organizations:   . Attends Archivist Meetings:   Marland Kitchen Marital Status:   Intimate Partner Violence:   . Fear of Current or Ex-Partner:   . Emotionally Abused:   .  Physically Abused:   . Sexually Abused:    Family History  Problem Relation Age of Onset  . Hypertension Mother   . Hyperlipidemia Mother   . Hypertension Father   . Breast cancer Paternal Grandmother   . Lung cancer Paternal Grandmother     OBJECTIVE:  Vitals:   04/26/20 0858  BP: (!) 144/85  Pulse: 81  Resp: 19  Temp: 98.1 F (36.7 C)  SpO2: 94%    General appearance: alert; no distress Eyes: PERRLA; EOMI HENT: normocephalic; atraumatic Neck: supple with FROM Lungs: clear to auscultation bilaterally Heart: regular rate and rhythm.   Extremities: no edema; symmetrical with no gross deformities Skin: warm and dry Neurologic: normal gait Psychological: alert and cooperative; normal mood and  affect  ASSESSMENT & PLAN:  1. Essential hypertension   2. Medication refill     Meds ordered this encounter  Medications  . losartan-hydrochlorothiazide (HYZAAR) 100-12.5 MG tablet    Sig: Take 1 tablet by mouth daily.    Dispense:  30 tablet    Refill:  0    Order Specific Question:   Supervising Provider    Answer:   Eustace Moore [[id number]]   Blood pressure medication refilled Please continue to monitor blood pressure at home and keep a log Eat a well balanced diet of fruits, vegetables and lean meats.  Avoid foods high in fat and salt Drink water.  At least half your body weight in ounces Exercise for at least 30 minutes daily [facility] Return or go to the ED if you have any new or worsening symptoms such as vision changes, fatigue, dizziness, chest pain, shortness of breath, nausea, swelling in your hands or feet, urinary symptoms, etc...  Reviewed expectations re: course of current medical issues. Questions answered. Outlined signs and symptoms indicating need for more acute intervention. Patient verbalized understanding. After Visit Summary given.   Rennis Harding, PA-C 04/26/20 [phone]

## 2020-05-01 ENCOUNTER — Emergency Department (HOSPITAL_COMMUNITY): Payer: No Typology Code available for payment source

## 2020-05-01 ENCOUNTER — Other Ambulatory Visit: Payer: Self-pay

## 2020-05-01 ENCOUNTER — Emergency Department (HOSPITAL_COMMUNITY)
Admission: EM | Admit: 2020-05-01 | Discharge: 2020-05-01 | Disposition: A | Discharge status: Home / Self Care | Payer: No Typology Code available for payment source | Attending: Emergency Medicine | Admitting: Emergency Medicine

## 2020-05-01 ENCOUNTER — Encounter (HOSPITAL_COMMUNITY): Payer: Self-pay

## 2020-05-01 DIAGNOSIS — Z79899 Other long term (current) drug therapy: Secondary | ICD-10-CM | POA: Diagnosis not present

## 2020-05-01 DIAGNOSIS — S86912A Strain of unspecified muscle(s) and tendon(s) at lower leg level, left leg, initial encounter: Secondary | ICD-10-CM | POA: Insufficient documentation

## 2020-05-01 DIAGNOSIS — E119 Type 2 diabetes mellitus without complications: Secondary | ICD-10-CM | POA: Diagnosis not present

## 2020-05-01 DIAGNOSIS — I10 Essential (primary) hypertension: Secondary | ICD-10-CM | POA: Diagnosis not present

## 2020-05-01 DIAGNOSIS — Y990 Civilian activity done for income or pay: Secondary | ICD-10-CM | POA: Diagnosis not present

## 2020-05-01 DIAGNOSIS — K429 Umbilical hernia without obstruction or gangrene: Secondary | ICD-10-CM | POA: Diagnosis not present

## 2020-05-01 DIAGNOSIS — Y9389 Activity, other specified: Secondary | ICD-10-CM | POA: Insufficient documentation

## 2020-05-01 DIAGNOSIS — S8992XA Unspecified injury of left lower leg, initial encounter: Secondary | ICD-10-CM | POA: Diagnosis present

## 2020-05-01 DIAGNOSIS — Y92410 Unspecified street and highway as the place of occurrence of the external cause: Secondary | ICD-10-CM | POA: Diagnosis not present

## 2020-05-01 DIAGNOSIS — Z7984 Long term (current) use of oral hypoglycemic drugs: Secondary | ICD-10-CM | POA: Insufficient documentation

## 2020-05-01 LAB — URINALYSIS, ROUTINE W REFLEX MICROSCOPIC
Bacteria, UA: NONE SEEN
Bilirubin Urine: NEGATIVE
Glucose, UA: >=500 mg/dL — AB
Hgb urine dipstick: NEGATIVE
Ketones, ur: 20 mg/dL — AB
Leukocytes,Ua: NEGATIVE
Nitrite: NEGATIVE
Protein, ur: 100 mg/dL — AB
Specific Gravity, Urine: 1.035 — ABNORMAL HIGH (ref 1.005–1.030)
pH: 5 (ref 5.0–8.0)

## 2020-05-01 LAB — CBC WITH DIFFERENTIAL/PLATELET
Abs Immature Granulocytes: 0.06 10*3/uL (ref 0.00–0.07)
Basophils Absolute: 0.1 10*3/uL (ref 0.0–0.1)
Basophils Relative: 1 %
Eosinophils Absolute: 0 10*3/uL (ref 0.0–0.5)
Eosinophils Relative: 0 %
HCT: 43.9 % (ref 39.0–52.0)
Hemoglobin: 14.8 g/dL (ref 13.0–17.0)
Immature Granulocytes: 1 %
Lymphocytes Relative: 23 %
Lymphs Abs: 2.1 10*3/uL (ref 0.7–4.0)
MCH: 28.5 pg (ref 26.0–34.0)
MCHC: 33.7 g/dL (ref 30.0–36.0)
MCV: 84.4 fL (ref 80.0–100.0)
Monocytes Absolute: 0.7 10*3/uL (ref 0.1–1.0)
Monocytes Relative: 8 %
Neutro Abs: 6.3 10*3/uL (ref 1.7–7.7)
Neutrophils Relative %: 67 %
Platelets: 189 10*3/uL (ref 150–400)
RBC: 5.2 MIL/uL (ref 4.22–5.81)
RDW: 12.4 % (ref 11.5–15.5)
WBC: 9.3 10*3/uL (ref 4.0–10.5)
nRBC: 0 % (ref 0.0–0.2)

## 2020-05-01 LAB — COMPREHENSIVE METABOLIC PANEL
ALT: 64 U/L — ABNORMAL HIGH (ref 0–44)
AST: 37 U/L (ref 15–41)
Albumin: 3.9 g/dL (ref 3.5–5.0)
Alkaline Phosphatase: 100 U/L (ref 38–126)
Anion gap: 13 (ref 5–15)
BUN: 17 mg/dL (ref 6–20)
CO2: 20 mmol/L — ABNORMAL LOW (ref 22–32)
Calcium: 9.1 mg/dL (ref 8.9–10.3)
Chloride: 102 mmol/L (ref 98–111)
Creatinine, Ser: 1.12 mg/dL (ref 0.61–1.24)
GFR calc Af Amer: >60 mL/min (ref >60)
GFR calc non Af Amer: >60 mL/min (ref >60)
Glucose, Bld: 300 mg/dL — ABNORMAL HIGH (ref 70–99)
Potassium: 3.8 mmol/L (ref 3.5–5.1)
Sodium: 135 mmol/L (ref 135–145)
Total Bilirubin: 0.9 mg/dL (ref 0.3–1.2)
Total Protein: 6.8 g/dL (ref 6.5–8.1)

## 2020-05-01 MED ORDER — HYDROMORPHONE HCL 1 MG/ML IJ SOLN
1.0000 mg | Freq: Once | INTRAMUSCULAR | Status: AC
  Administered 2020-05-01: 1 mg via INTRAVENOUS
  Filled 2020-05-01: qty 1

## 2020-05-01 MED ORDER — HYDROCODONE-ACETAMINOPHEN 5-325 MG PO TABS: 1.0000 | ORAL_TABLET | ORAL | 0 refills | Status: AC | PRN

## 2020-05-01 MED ORDER — IOHEXOL 300 MG/ML  SOLN
100.0000 mL | Freq: Once | INTRAMUSCULAR | Status: AC | PRN
  Administered 2020-05-01: 100 mL via INTRAVENOUS

## 2020-05-01 MED ORDER — DIAZEPAM 5 MG PO TABS: 5.0000 mg | ORAL_TABLET | Freq: Two times a day (BID) | ORAL | 0 refills | Status: AC

## 2020-05-01 MED ORDER — ONDANSETRON HCL 4 MG/2ML IJ SOLN
4.0000 mg | Freq: Once | INTRAMUSCULAR | Status: AC
  Administered 2020-05-01: 4 mg via INTRAVENOUS
  Filled 2020-05-01: qty 2

## 2020-05-01 MED ORDER — MORPHINE SULFATE (PF) 4 MG/ML IV SOLN
4.0000 mg | Freq: Once | INTRAVENOUS | Status: AC
  Administered 2020-05-01: 4 mg via INTRAVENOUS
  Filled 2020-05-01: qty 1

## 2020-05-01 NOTE — ED Notes (Signed)
Called ortho tech for knee immobilizer, left message.
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
## 2020-05-01 NOTE — Progress Notes (Signed)
Orthopedic Tech Progress Note Patient Details:  Steven Adkins 1986-02-17 [id number]  Ortho Devices Type of Ortho Device: Crutches, Knee Immobilizer Ortho Device/Splint Location: lle Ortho Device/Splint Interventions: Ordered, Application, Adjustment   Post Interventions Patient Tolerated: Well Instructions Provided: Care of device, Adjustment of device   [name] Post 05/01/2020, 11:29 PM

## 2020-05-01 NOTE — ED Notes (Signed)
Patient verbalizes understanding of discharge instructions. Opportunity for questioning and answers were provided. Armband removed by staff, pt discharged from ED ambulatory w/ crutches.  
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
## 2021-08-27 ENCOUNTER — Ambulatory Visit
Admission: EM | Admit: 2021-08-27 | Discharge: 2021-08-27 | Disposition: A | Discharge status: Home / Self Care | Payer: Commercial Managed Care - PPO | Attending: Family Medicine | Admitting: Family Medicine

## 2021-08-27 ENCOUNTER — Other Ambulatory Visit: Payer: Self-pay

## 2021-08-27 DIAGNOSIS — N5089 Other specified disorders of the male genital organs: Secondary | ICD-10-CM

## 2021-08-27 NOTE — ED Triage Notes (Signed)
Pt presents with c/o possible abscess in perineal area for past couple weeks

## 2021-08-27 NOTE — ED Provider Notes (Signed)
  [facility]   [id number] 08/27/21 Arrival Time: 7124  ASSESSMENT & PLAN:  1. Scrotal mass    Discussed need for urological evaluation.  Recommend:  Follow-up Information     Call  [facility].   Contact information: [address] [place] [address] [phone]        Call  [facility].   Specialty: Urology Contact information: [address] [place] [address] [phone]               Reviewed expectations re: course of current medical issues. Questions answered. Outlined signs and symptoms indicating need for more acute intervention. Patient verbalized understanding. After Visit Summary given.   SUBJECTIVE:  Steven Adkins is a 35 y.o. male who presents with complaint of swelling of R scrotum; noted over past 2 weeks; no injury/trauma; no h/o similar. Is tender at times. "Tried to pop it but nothing came out"; sev d ago. Normal urination. Afebrile. "Felt chilled today".  OBJECTIVE:  Vitals:   08/27/21 0939  BP: 128/79  Pulse: (!) 118  Resp: 18  Temp: 98.2 F (36.8 C)  SpO2: 95%    Pulse noted. General appearance: alert, cooperative, appears stated age and no distress GU: hard/firm R scrotal mass; at least 2x2 cm but feels irregular; no overly skin changes Skin: warm and dry Psychological: alert and cooperative; normal mood and affect.  No Known Allergies  Past Medical History:  Diagnosis Date   Anxiety    Diabetes mellitus without complication (HCC)    Hypertension    Migraines    OSA (obstructive sleep apnea)    PTSD (post-traumatic stress disorder)    Family History  Problem Relation Age of Onset   Hypertension Mother    Hyperlipidemia Mother    Hypertension Father    Breast cancer Paternal Grandmother    Lung cancer Paternal Grandmother    Social History   Socioeconomic History   Marital status: Divorced    Spouse name: Not on  file   Number of children: Not on file   Years of education: Not on file   Highest education level: Not on file  Occupational History   Occupation: police dispatcher  Tobacco Use   Smoking status: Never   Smokeless tobacco: Never  Substance and Sexual Activity   Alcohol use: Yes    Alcohol/week: 7.0 standard drinks    Types: 7 Cans of beer per week    Comment: once a month   Drug use: No   Sexual activity: Not Currently  Other Topics Concern   Not on file  Social History Narrative   Not on file   Social Determinants of Health   Financial Resource Strain: Not on file  Food Insecurity: Not on file  Transportation Needs: Not on file  Physical Activity: Not on file  Stress: Not on file  Social Connections: Not on file  Intimate Partner Violence: Not on file           Mardella Layman, MD 08/27/21 1029
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
## 2021-10-13 ENCOUNTER — Other Ambulatory Visit (HOSPITAL_COMMUNITY): Payer: Self-pay

## 2021-10-13 ENCOUNTER — Other Ambulatory Visit (HOSPITAL_BASED_OUTPATIENT_CLINIC_OR_DEPARTMENT_OTHER): Payer: Self-pay

## 2021-10-14 ENCOUNTER — Other Ambulatory Visit (HOSPITAL_COMMUNITY): Payer: Self-pay

## 2021-10-14 MED ORDER — INFLUENZA VAC SPLIT QUAD 0.5 ML IM SUSY
PREFILLED_SYRINGE | INTRAMUSCULAR | 0 refills | Status: AC
  Filled 2021-10-14 – 2021-10-15 (×3): qty 0.5, 1d supply, fill #0

## 2021-10-15 ENCOUNTER — Other Ambulatory Visit (HOSPITAL_BASED_OUTPATIENT_CLINIC_OR_DEPARTMENT_OTHER): Payer: Self-pay

## 2022-09-21 ENCOUNTER — Telehealth: Payer: Self-pay

## 2022-09-21 NOTE — Telephone Encounter (Signed)
New message    Referral from [facility] sent on  05/22/22 @ 15:12pm  Spoke with patient on 05/26/22 - voiced he will call back   On  08/06/22 - mail letter to call the office to set up new patient appt.   On 09/21/22 - Call and spoke with patient voiced to remove referral - do not wish to come -- do not understand why MD sent him to Endocrinology.    Referral is closed out

## 2022-11-26 ENCOUNTER — Ambulatory Visit: Payer: Commercial Managed Care - PPO | Admitting: Podiatry
